# Patient Record
Sex: Female | Born: 1980 | Race: Black or African American | Hispanic: No | Smoking: Never smoker
Health system: Southern US, Community
[De-identification: ages and names within clinical notes are randomized; demographics above are authoritative.]

## PROBLEM LIST (undated history)

## (undated) ENCOUNTER — Inpatient Hospital Stay (HOSPITAL_COMMUNITY): Payer: Self-pay

## (undated) DIAGNOSIS — W3400XA Accidental discharge from unspecified firearms or gun, initial encounter: Secondary | ICD-10-CM

## (undated) DIAGNOSIS — F32A Depression, unspecified: Secondary | ICD-10-CM

## (undated) DIAGNOSIS — B009 Herpesviral infection, unspecified: Secondary | ICD-10-CM

## (undated) DIAGNOSIS — N83209 Unspecified ovarian cyst, unspecified side: Secondary | ICD-10-CM

## (undated) DIAGNOSIS — R87619 Unspecified abnormal cytological findings in specimens from cervix uteri: Secondary | ICD-10-CM

## (undated) DIAGNOSIS — F419 Anxiety disorder, unspecified: Secondary | ICD-10-CM

## (undated) DIAGNOSIS — F329 Major depressive disorder, single episode, unspecified: Secondary | ICD-10-CM

## (undated) DIAGNOSIS — IMO0002 Reserved for concepts with insufficient information to code with codable children: Secondary | ICD-10-CM

## (undated) DIAGNOSIS — O1490 Unspecified pre-eclampsia, unspecified trimester: Secondary | ICD-10-CM

## (undated) DIAGNOSIS — E282 Polycystic ovarian syndrome: Secondary | ICD-10-CM

## (undated) HISTORY — DX: Unspecified pre-eclampsia, unspecified trimester: O14.90

---
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
## 2002-08-24 ENCOUNTER — Inpatient Hospital Stay (HOSPITAL_COMMUNITY): Admission: AD | Admit: 2002-08-24 | Discharge: 2002-08-24 | Payer: Self-pay | Admitting: Obstetrics and Gynecology

## 2002-11-03 ENCOUNTER — Encounter: Admission: RE | Admit: 2002-11-03 | Discharge: 2002-11-03 | Payer: Self-pay | Admitting: Obstetrics and Gynecology

## 2003-11-04 ENCOUNTER — Emergency Department (HOSPITAL_COMMUNITY): Admission: EM | Admit: 2003-11-04 | Discharge: 2003-11-04 | Payer: Self-pay | Admitting: Emergency Medicine

## 2004-07-04 ENCOUNTER — Ambulatory Visit (HOSPITAL_COMMUNITY): Admission: RE | Admit: 2004-07-04 | Discharge: 2004-07-04 | Payer: Self-pay | Admitting: Obstetrics and Gynecology

## 2004-11-08 HISTORY — PX: LAPAROSCOPY: SHX197

## 2004-12-06 ENCOUNTER — Ambulatory Visit (HOSPITAL_COMMUNITY): Admission: RE | Admit: 2004-12-06 | Discharge: 2004-12-06 | Payer: Self-pay | Admitting: Obstetrics and Gynecology

## 2004-12-06 ENCOUNTER — Encounter (INDEPENDENT_AMBULATORY_CARE_PROVIDER_SITE_OTHER): Payer: Self-pay | Admitting: Specialist

## 2005-10-11 ENCOUNTER — Other Ambulatory Visit: Admission: RE | Admit: 2005-10-11 | Discharge: 2005-10-11 | Payer: Self-pay | Admitting: Obstetrics and Gynecology

## 2005-10-13 ENCOUNTER — Emergency Department (HOSPITAL_COMMUNITY): Admission: EM | Admit: 2005-10-13 | Discharge: 2005-10-13 | Payer: Self-pay | Admitting: Emergency Medicine

## 2006-10-14 ENCOUNTER — Other Ambulatory Visit: Admission: RE | Admit: 2006-10-14 | Discharge: 2006-10-14 | Payer: Self-pay | Admitting: *Deleted

## 2006-11-12 ENCOUNTER — Ambulatory Visit (HOSPITAL_COMMUNITY): Admission: RE | Admit: 2006-11-12 | Discharge: 2006-11-12 | Payer: Self-pay | Admitting: Gynecology

## 2007-12-12 ENCOUNTER — Inpatient Hospital Stay (HOSPITAL_COMMUNITY): Admission: AD | Admit: 2007-12-12 | Discharge: 2007-12-13 | Payer: Self-pay | Admitting: Gynecology

## 2007-12-12 ENCOUNTER — Ambulatory Visit: Payer: Self-pay | Admitting: Obstetrics and Gynecology
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
## 2010-05-26 NOTE — Group Therapy Note (Signed)
   NAME:  [name] [name], [name] [name]                        ACCOUNT NO.:  [id number]   MEDICAL RECORD NO.:  [id number]                   PATIENT TYPE:  OUT   LOCATION:  [facility]                           FACILITY:  [facility]   PHYSICIAN:  Argentina Donovan, MD                     DATE OF BIRTH:  04-20-1980   DATE OF SERVICE:                                    CLINIC NOTE   The patient is a 30 year old black female, nulligravida, who has been  amenorrheic since May 2004.  Has had a little spotting intermittently and  has never had regular periods.  Although this is the longest period of  amenorrhea.  She was referred to the [facility] after being screened for pelvic  infections and all was negative.  We did a Pap smear today and the patient  has had unprotected sex for two years.  I have suggested she go on vitamins  with folic acid and especially since her plan is to eventually get pregnant.  She will fall under her husband's health insurance when she gets married in  three weeks and so we will put off her significant workup until after that  time.  She is marked hirsute with acne and amenorrhea and most probable  polycystic ovarian syndrome.  We will give her Provera challenge test at  this point and defer the rest of the workup until we see her at a later  date.                                               Argentina Donovan, MD    PR/MEDQ  D:  11/03/2002  T:  11/03/2002  Job:  [id number]
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
## 2010-09-25 ENCOUNTER — Inpatient Hospital Stay (HOSPITAL_COMMUNITY): Payer: Self-pay

## 2010-09-25 ENCOUNTER — Inpatient Hospital Stay (HOSPITAL_COMMUNITY)
Admission: AD | Admit: 2010-09-25 | Discharge: 2010-09-25 | Disposition: A | Discharge status: Home / Self Care | Payer: Self-pay | Source: Ambulatory Visit | Attending: Obstetrics & Gynecology | Admitting: Obstetrics & Gynecology

## 2010-09-25 DIAGNOSIS — A5901 Trichomonal vulvovaginitis: Secondary | ICD-10-CM

## 2010-09-25 DIAGNOSIS — O039 Complete or unspecified spontaneous abortion without complication: Secondary | ICD-10-CM | POA: Insufficient documentation

## 2010-09-25 LAB — URINALYSIS, ROUTINE W REFLEX MICROSCOPIC
Bilirubin Urine: NEGATIVE
Glucose, UA: NEGATIVE mg/dL
Specific Gravity, Urine: 1.015 (ref 1.005–1.030)

## 2010-09-25 LAB — CBC
Hemoglobin: 12.5 g/dL (ref 12.0–15.0)
Platelets: 307 10*3/uL (ref 150–400)
RBC: 3.75 MIL/uL — ABNORMAL LOW (ref 3.87–5.11)
WBC: 7.7 10*3/uL (ref 4.0–10.5)

## 2010-09-25 LAB — URINE MICROSCOPIC-ADD ON

## 2010-09-25 LAB — WET PREP, GENITAL
WBC, Wet Prep HPF POC: NONE SEEN
Yeast Wet Prep HPF POC: NONE SEEN

## 2010-09-25 LAB — ABO/RH: ABO/RH(D): O POS

## 2010-09-25 LAB — POCT PREGNANCY, URINE: Preg Test, Ur: POSITIVE

## 2010-09-25 LAB — HCG, QUANTITATIVE, PREGNANCY: hCG, Beta Chain, Quant, S: 735 m[IU]/mL — ABNORMAL HIGH (ref <5)

## 2010-09-25 MED ORDER — METRONIDAZOLE 500 MG PO TABS
2000.0000 mg | ORAL_TABLET | Freq: Once | ORAL | Status: AC
  Administered 2010-09-25: 2000 mg via ORAL
  Filled 2010-09-25: qty 4

## 2010-09-25 NOTE — Progress Notes (Signed)
Pt reports LMP 07/06/2010, pt states she did not have a preg test but last Wednesday she had a "miscarraige", states she started bleeding on 09/06 also started having painful cramps. This continued off/on until last Wednesday and the cramping got "severe", and then she passed two large clots that she thought "looked like a baby". States the bleeding is still heavy (pad changed every 1 hour), today cramping is an "8", pt states she has never been pregnant.

## 2010-09-25 NOTE — Progress Notes (Signed)
PT  SAYS HER BLEEDING STARTED ON 09-14-2010-   BEFORE THIS HAD BROWN D/C.     PT WENT TO [place]  ON 09-14-2010- GOT BACK  ON 09-18-2010- HAD ONE DAY  WITH NO BLEEDING.    CRAMPING NOW-   AT HOME TOOK [facility] AND [facility]  YESTERDAY . NO MED    TODAY.  [facility]. THEN WENT TO [facility] ON [address]-  COULD NOT SEE HER., THEN WENT TO [facility]-  WAS BLEEDING.   NOW IN ROOM- NOTHING ON PAD AND NOTHING ON PERINEUM.

## 2010-09-25 NOTE — ED Provider Notes (Signed)
History   Possible SAB at home 09/20/10. Moderate to heavy bleeding continues. Uncertain LMP. Did not know was pregnant. Hx infertility w/ extensive work-up and Lap lysis of adhesions.  CSN: [id number] Arrival date & time: 09/25/2010  8:45 PM   Chief Complaint  Patient presents with  . Menorrhagia     (Include location/radiation/quality/duration/timing/severity/associated sxs/prior treatment) HPI   No past medical history on file. Infertility PCOS  No past surgical history on file. Lap lysis of adhesions for infertility  No family history on file.  History  Substance Use Topics  . Smoking status: Not on file  . Smokeless tobacco: Not on file  . Alcohol Use: Not on file    OB History    No data available    None  Review of Systems  Constitutional: Negative for fever and chills.  Genitourinary: Negative for vaginal bleeding and pelvic pain.  Neurological: Negative for dizziness, syncope and light-headedness.    Allergies  Penicillins  Home Medications  No current outpatient prescriptions on file.  Physical Exam    BP 125/83  Pulse 85  Temp(Src) 98.4 F (36.9 C) (Oral)  Resp 18  Ht 5\' 3"  (1.6 m)  Wt 71.668 kg (158 lb)  BMI 27.99 kg/m2  LMP 09/15/2010  Physical Exam  Constitutional: She appears well-developed and well-nourished. No distress.  Cardiovascular: Normal rate.   Pulmonary/Chest: Effort normal.  Abdominal: Soft. Bowel sounds are normal. She exhibits no mass. There is no tenderness.  Pelvic: Small BRB. No clots or tissue. Cervix long and closed. Uterus slightly enlarged.  ED Course  Procedures  Results for orders placed during the hospital encounter of 09/25/10  URINALYSIS, ROUTINE W REFLEX MICROSCOPIC      Component Value Range   Color, Urine YELLOW  YELLOW    Appearance CLEAR  CLEAR    Specific Gravity, Urine 1.015  1.005 - 1.030    pH 7.5  5.0 - 8.0    Glucose, UA NEGATIVE  NEGATIVE (mg/dL)   Hgb urine dipstick MODERATE (*) NEGATIVE     Bilirubin Urine NEGATIVE  NEGATIVE    Ketones, ur NEGATIVE  NEGATIVE (mg/dL)   Protein, ur NEGATIVE  NEGATIVE (mg/dL)   Urobilinogen, UA 1.0  0.0 - 1.0 (mg/dL)   Nitrite NEGATIVE  NEGATIVE    Leukocytes, UA TRACE (*) NEGATIVE   POCT PREGNANCY, URINE      Component Value Range   Preg Test, Ur POSITIVE    ABO/RH      Component Value Range   ABO/RH(D) O POS    HCG, QUANTITATIVE, PREGNANCY      Component Value Range   hCG, Beta Chain, Quant, S 735 (*) <5 (mIU/mL)  CBC      Component Value Range   WBC 7.7  4.0 - 10.5 (K/uL)   RBC 3.75 (*) 3.87 - 5.11 (MIL/uL)   Hemoglobin 12.5  12.0 - 15.0 (g/dL)   HCT 40.9 (*) 81.1 - 46.0 (%)   MCV 95.7  78.0 - 100.0 (fL)   MCH 33.3  26.0 - 34.0 (pg)   MCHC 34.8  30.0 - 36.0 (g/dL)   RDW 91.4  78.2 - 95.6 (%)   Platelets 307  150 - 400 (K/uL)  URINE MICROSCOPIC-ADD ON      Component Value Range   Squamous Epithelial / LPF FEW (*) RARE    WBC, UA 3-6  <3 (WBC/hpf)   RBC / HPF 3-6  <3 (RBC/hpf)   Bacteria, UA RARE  RARE   WET PREP,  GENITAL      Component Value Range   Yeast, Wet Prep NONE SEEN  NONE SEEN    Trich, Wet Prep FEW (*) NONE SEEN    Clue Cells, Wet Prep FEW (*) NONE SEEN    WBC, Wet Prep HPF POC NONE SEEN  NONE SEEN    US Ob Comp Less 14 Wks  09/25/2010  *RADIOLOGY REPORT*  Clinical Data: 30 year old pregnant female with heavy vaginal bleeding and passing tissue.  Estimated gestational age of [redacted] weeks 4 days by LMP.  OBSTETRIC <14 WK [place] AND TRANSVAGINAL OB US  Technique:  Both transabdominal and transvaginal ultrasound examinations were performed for complete evaluation of the gestation as well as the maternal uterus, adnexal regions, and pelvic cul-de-sac.  Transvaginal technique was performed to assess early pregnancy.  Comparison:  None.  Intrauterine gestational sac:  Not visualized Yolk sac: Not visualized Embryo: Not visualized  Maternal uterus/adnexae: The uterus is normal in appearance. Endometrial stripe measures 7 mm in  diameter and is slightly heterogeneous.  The ovaries bilaterally are unremarkable.  There is no evidence of free fluid or adnexal mass.  IMPRESSION: No evidence of intrauterine gestation.  Given this patient's history, this is suspicious for a spontaneous abortion.  Consider follow-up as indicated.  Otherwise unremarkable exam.  Original Report Authenticated By: Rosendo Gros, M.D.   Assessment: 1. Completed AB vs early pregnancy 2. VB stable  Plan: 1. Return in 48 hours for F/U quant per consult w/ Dr. Penne Lash. 2. Support given 3. Ectopic and bleeding precautions

## 2010-09-27 ENCOUNTER — Encounter (HOSPITAL_COMMUNITY): Payer: Self-pay | Admitting: *Deleted

## 2010-09-27 ENCOUNTER — Inpatient Hospital Stay (HOSPITAL_COMMUNITY)
Admission: AD | Admit: 2010-09-27 | Discharge: 2010-09-27 | Disposition: A | Discharge status: Home / Self Care | Payer: Self-pay | Source: Ambulatory Visit | Attending: Obstetrics and Gynecology | Admitting: Obstetrics and Gynecology

## 2010-09-27 DIAGNOSIS — A7489 Other chlamydial diseases: Secondary | ICD-10-CM

## 2010-09-27 DIAGNOSIS — O039 Complete or unspecified spontaneous abortion without complication: Secondary | ICD-10-CM | POA: Insufficient documentation

## 2010-09-27 DIAGNOSIS — A749 Chlamydial infection, unspecified: Secondary | ICD-10-CM

## 2010-09-27 HISTORY — DX: Unspecified ovarian cyst, unspecified side: N83.209

## 2010-09-27 HISTORY — DX: Depression, unspecified: F32.A

## 2010-09-27 HISTORY — DX: Reserved for concepts with insufficient information to code with codable children: IMO0002

## 2010-09-27 HISTORY — DX: Major depressive disorder, single episode, unspecified: F32.9

## 2010-09-27 HISTORY — DX: Polycystic ovarian syndrome: E28.2

## 2010-09-27 HISTORY — DX: Anxiety disorder, unspecified: F41.9

## 2010-09-27 HISTORY — DX: Unspecified abnormal cytological findings in specimens from cervix uteri: R87.619

## 2010-09-27 LAB — HCG, QUANTITATIVE, PREGNANCY: hCG, Beta Chain, Quant, S: 338 m[IU]/mL — ABNORMAL HIGH (ref <5)

## 2010-09-27 MED ORDER — AZITHROMYCIN 250 MG PO TABS: ORAL_TABLET | ORAL | Status: DC
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
## 2010-09-27 NOTE — Progress Notes (Signed)
Pt states, " I was told to return for lab work today. I had a miscarriage seven days ago. I am bleeding like at the end of a period, and still having mild cramping."

## 2010-10-02 NOTE — ED Provider Notes (Signed)
Beta 735.  Follow up Beta HCG in 2 days. Agree with above note.  LEGGETT,KELLY H. 10/02/2010 2:13 PM

## 2010-10-04 ENCOUNTER — Inpatient Hospital Stay (HOSPITAL_COMMUNITY)
Admission: AD | Admit: 2010-10-04 | Discharge: 2010-10-05 | Disposition: A | Discharge status: Home / Self Care | Payer: Self-pay | Source: Ambulatory Visit | Attending: Obstetrics and Gynecology | Admitting: Obstetrics and Gynecology

## 2010-10-04 DIAGNOSIS — O039 Complete or unspecified spontaneous abortion without complication: Secondary | ICD-10-CM | POA: Insufficient documentation

## 2010-10-04 LAB — HCG, QUANTITATIVE, PREGNANCY: hCG, Beta Chain, Quant, S: 60 m[IU]/mL — ABNORMAL HIGH (ref <5)

## 2010-10-04 NOTE — Progress Notes (Signed)
PT SAYS NO PAIN / NO BLEEDING . HAS HAD LABS DRAWN.  LAST HCG-300.

## 2010-10-04 NOTE — ED Provider Notes (Signed)
  Chief Complaint:  Scheduled f/u quant  [name] [name] is  30 y.o. G1P0 Patient's last menstrual period was 09/15/2010.Marland Kitchen Patient is here for follow up of quantitative HCG and ongoing surveillance of pregnancy status.   She is 4.[redacted] wks gestation  by LMP.  She was treated for trich at the initial visit and she and her partner were treated for chlamydia.  Since her last visit, the patient is without new complaint.   The patient reports bleeding as  none now.    General ROS:  negative for abd pain or cramps, passage of tissue  Her previous Quantitative HCG values are: 09/25/10 735; 09/27/10 338.    Physical Exam   Last menstrual period 09/15/2010.   Labs: Recent Results (from the past 24 hour(s))  HCG, QUANTITATIVE, PREGNANCY   Collection Time   10/04/10 10:15 PM      Component Value Range   hCG, Beta Chain, Quant, S 60 (*) <5 (mIU/mL)    Ultrasound Studies:   US Ob Comp Less 14 Wks  09/25/2010  *RADIOLOGY REPORT*  Clinical Data: 30 year old pregnant female with heavy vaginal bleeding and passing tissue.  Estimated gestational age of [redacted] weeks 4 days by LMP.  OBSTETRIC <14 WK [place] AND TRANSVAGINAL OB US  Technique:  Both transabdominal and transvaginal ultrasound examinations were performed for complete evaluation of the gestation as well as the maternal uterus, adnexal regions, and pelvic cul-de-sac.  Transvaginal technique was performed to assess early pregnancy.  Comparison:  None.  Intrauterine gestational sac:  Not visualized Yolk sac: Not visualized Embryo: Not visualized  Maternal uterus/adnexae: The uterus is normal in appearance. Endometrial stripe measures 7 mm in diameter and is slightly heterogeneous.  The ovaries bilaterally are unremarkable.  There is no evidence of free fluid or adnexal mass.  IMPRESSION: No evidence of intrauterine gestation.  Given this patient's history, this is suspicious for a spontaneous abortion.  Consider follow-up as indicated.  Otherwise unremarkable exam.   Original Report Authenticated By: Rosendo Gros, M.D.   Assessment: Dropping quants c/w SAB   Plan: Follow quant in 1 week. Bleeding and pain precautions reviewed.   [name] [name] 10/04/2010, 11:48 PM

## 2010-10-13 LAB — URINALYSIS, ROUTINE W REFLEX MICROSCOPIC
Nitrite: NEGATIVE
Specific Gravity, Urine: >1.03 — ABNORMAL HIGH (ref 1.005–1.030)
Urobilinogen, UA: 0.2 mg/dL (ref 0.0–1.0)

## 2010-10-13 LAB — CBC
MCV: 96.3 fL (ref 78.0–100.0)
Platelets: 275 10*3/uL (ref 150–400)
WBC: 5.5 10*3/uL (ref 4.0–10.5)

## 2010-10-13 LAB — COMPREHENSIVE METABOLIC PANEL
ALT: 26 U/L (ref 0–35)
AST: 22 U/L (ref 0–37)
Albumin: 3.6 g/dL (ref 3.5–5.2)
Calcium: 8.9 mg/dL (ref 8.4–10.5)
Chloride: 106 mEq/L (ref 96–112)
Creatinine, Ser: 0.71 mg/dL (ref 0.4–1.2)
GFR calc Af Amer: >60 mL/min (ref >60)
Sodium: 139 mEq/L (ref 135–145)
Total Bilirubin: 0.3 mg/dL (ref 0.3–1.2)

## 2010-10-13 LAB — GC/CHLAMYDIA PROBE AMP, GENITAL: Chlamydia, DNA Probe: NEGATIVE

## 2010-10-13 LAB — WET PREP, GENITAL

## 2011-01-27 ENCOUNTER — Inpatient Hospital Stay (HOSPITAL_COMMUNITY): Payer: Medicaid Other

## 2011-01-27 ENCOUNTER — Encounter (HOSPITAL_COMMUNITY): Payer: Self-pay

## 2011-01-27 ENCOUNTER — Inpatient Hospital Stay (HOSPITAL_COMMUNITY)
Admission: AD | Admit: 2011-01-27 | Discharge: 2011-01-27 | Disposition: A | Discharge status: Home / Self Care | Payer: Medicaid Other | Source: Ambulatory Visit | Attending: Obstetrics and Gynecology | Admitting: Obstetrics and Gynecology

## 2011-01-27 DIAGNOSIS — O2390 Unspecified genitourinary tract infection in pregnancy, unspecified trimester: Secondary | ICD-10-CM | POA: Insufficient documentation

## 2011-01-27 DIAGNOSIS — O26899 Other specified pregnancy related conditions, unspecified trimester: Secondary | ICD-10-CM

## 2011-01-27 DIAGNOSIS — N760 Acute vaginitis: Secondary | ICD-10-CM | POA: Insufficient documentation

## 2011-01-27 DIAGNOSIS — N949 Unspecified condition associated with female genital organs and menstrual cycle: Secondary | ICD-10-CM

## 2011-01-27 DIAGNOSIS — R109 Unspecified abdominal pain: Secondary | ICD-10-CM | POA: Insufficient documentation

## 2011-01-27 DIAGNOSIS — A499 Bacterial infection, unspecified: Secondary | ICD-10-CM | POA: Insufficient documentation

## 2011-01-27 DIAGNOSIS — B9689 Other specified bacterial agents as the cause of diseases classified elsewhere: Secondary | ICD-10-CM | POA: Insufficient documentation

## 2011-01-27 LAB — URINE MICROSCOPIC-ADD ON

## 2011-01-27 LAB — URINALYSIS, ROUTINE W REFLEX MICROSCOPIC
Ketones, ur: NEGATIVE mg/dL
Leukocytes, UA: NEGATIVE
Nitrite: NEGATIVE
Protein, ur: NEGATIVE mg/dL
Urobilinogen, UA: 0.2 mg/dL (ref 0.0–1.0)

## 2011-01-27 LAB — CBC
MCH: 33.1 pg (ref 26.0–34.0)
MCHC: 35.5 g/dL (ref 30.0–36.0)
Platelets: 330 10*3/uL (ref 150–400)
RDW: 12.7 % (ref 11.5–15.5)

## 2011-01-27 LAB — WET PREP, GENITAL: Yeast Wet Prep HPF POC: NONE SEEN

## 2011-01-27 LAB — ABO/RH: ABO/RH(D): O POS

## 2011-01-27 MED ORDER — METRONIDAZOLE 500 MG PO TABS: 500.0000 mg | ORAL_TABLET | Freq: Two times a day (BID) | ORAL | Status: AC
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
## 2011-01-27 NOTE — Progress Notes (Signed)
Pt reports having miscarriage in September. Had normal period in Nov. 11/7. Pt had period in December 12/12 (late). Pt reports no period this month and had positive pregnancy test today. [facility]old to [facility]  if she got pregnant [facility].

## 2011-01-30 NOTE — ED Provider Notes (Signed)
Agree with above note.  [name] [name] 01/30/2011 8:02 AM   

## 2011-02-13 LAB — OB RESULTS CONSOLE ANTIBODY SCREEN: Antibody Screen: NEGATIVE

## 2011-02-13 LAB — OB RESULTS CONSOLE ABO/RH

## 2011-03-05 ENCOUNTER — Inpatient Hospital Stay (HOSPITAL_COMMUNITY): Payer: Medicaid Other

## 2011-03-05 ENCOUNTER — Inpatient Hospital Stay (HOSPITAL_COMMUNITY)
Admission: AD | Admit: 2011-03-05 | Discharge: 2011-03-05 | Disposition: A | Discharge status: Home / Self Care | Payer: Medicaid Other | Source: Ambulatory Visit | Attending: Obstetrics and Gynecology | Admitting: Obstetrics and Gynecology

## 2011-03-05 ENCOUNTER — Encounter (HOSPITAL_COMMUNITY): Payer: Self-pay | Admitting: *Deleted

## 2011-03-05 DIAGNOSIS — R109 Unspecified abdominal pain: Secondary | ICD-10-CM | POA: Insufficient documentation

## 2011-03-05 DIAGNOSIS — O209 Hemorrhage in early pregnancy, unspecified: Secondary | ICD-10-CM | POA: Insufficient documentation

## 2011-03-05 LAB — WET PREP, GENITAL
Trich, Wet Prep: NONE SEEN
Yeast Wet Prep HPF POC: NONE SEEN

## 2011-03-05 LAB — URINALYSIS, ROUTINE W REFLEX MICROSCOPIC
Glucose, UA: NEGATIVE mg/dL
Leukocytes, UA: NEGATIVE
Protein, ur: 100 mg/dL — AB
Specific Gravity, Urine: >1.03 — ABNORMAL HIGH (ref 1.005–1.030)
pH: 5.5 (ref 5.0–8.0)

## 2011-03-05 LAB — URINE MICROSCOPIC-ADD ON

## 2011-03-05 MED ORDER — METRONIDAZOLE 500 MG PO TABS: 500.0000 mg | ORAL_TABLET | Freq: Two times a day (BID) | ORAL | Status: AC

## 2011-03-05 NOTE — Progress Notes (Signed)
S. Lillard, CNM at bedside.  Assessment done and poc discussed with pt.  

## 2011-03-05 NOTE — Discharge Instructions (Signed)
Call your doctor with any concerns or questions.
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
## 2011-03-05 NOTE — Progress Notes (Signed)
Dr. Estanislado Pandy notified of pt presenting to [facility] with c/o cramping. Notified of pt c/o spotting when wiping.  Notified of pt being upset with s/o about another female.  Notified of inability to find CNM.  Called and left message and no return call.  No answer from call room.  MD will locate CNM.
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
## 2011-03-05 NOTE — Progress Notes (Signed)
SSE done per CNM.  Wet prep and cultures collected.  VE done.  

## 2011-03-05 NOTE — Progress Notes (Signed)
Pt reports she had become upset (emothionally) at home earlier and then started having lower abd cramping. States denies bleeding.

## 2011-03-05 NOTE — Progress Notes (Signed)
Provider notified that pt not able to take flagyl.  Pt requesting gel for BV.  Provider to call in script for pt.

## 2011-03-06 LAB — GC/CHLAMYDIA PROBE AMP, GENITAL
Chlamydia, DNA Probe: NEGATIVE
GC Probe Amp, Genital: NEGATIVE

## 2011-03-13 ENCOUNTER — Encounter (INDEPENDENT_AMBULATORY_CARE_PROVIDER_SITE_OTHER): Payer: Medicaid Other | Admitting: Obstetrics and Gynecology

## 2011-03-13 DIAGNOSIS — Z331 Pregnant state, incidental: Secondary | ICD-10-CM

## 2011-04-10 ENCOUNTER — Encounter (INDEPENDENT_AMBULATORY_CARE_PROVIDER_SITE_OTHER): Payer: Medicaid Other | Admitting: Obstetrics and Gynecology

## 2011-04-10 DIAGNOSIS — Z3400 Encounter for supervision of normal first pregnancy, unspecified trimester: Secondary | ICD-10-CM

## 2011-05-07 ENCOUNTER — Other Ambulatory Visit: Payer: Self-pay | Admitting: Obstetrics and Gynecology

## 2011-05-07 DIAGNOSIS — Z1389 Encounter for screening for other disorder: Secondary | ICD-10-CM

## 2011-05-08 ENCOUNTER — Ambulatory Visit (INDEPENDENT_AMBULATORY_CARE_PROVIDER_SITE_OTHER): Payer: Medicaid Other | Admitting: Obstetrics and Gynecology

## 2011-05-08 ENCOUNTER — Other Ambulatory Visit: Payer: Medicaid Other

## 2011-05-08 ENCOUNTER — Encounter: Payer: Self-pay | Admitting: Obstetrics and Gynecology

## 2011-05-08 ENCOUNTER — Ambulatory Visit (INDEPENDENT_AMBULATORY_CARE_PROVIDER_SITE_OTHER): Payer: Medicaid Other

## 2011-05-08 VITALS — BP 98/64 | Wt 168.0 lb

## 2011-05-08 DIAGNOSIS — Z1389 Encounter for screening for other disorder: Secondary | ICD-10-CM

## 2011-05-08 DIAGNOSIS — Z331 Pregnant state, incidental: Secondary | ICD-10-CM

## 2011-05-08 LAB — US OB DETAIL + 14 WK

## 2011-05-08 MED ORDER — PRAMOXINE HCL 1 % RE FOAM: RECTAL | Status: AC | PRN

## 2011-05-08 NOTE — Progress Notes (Signed)
Pt c/o cramping yesterday. She also thinks she may have a hemorrhoid.  Pt denies having any  Bleeding or leakage of fluid.   Abd: gravid soft NT cx long and closed Small hemorrhoid noted. Rt in 4 weeks proctofoam for hemorrhoid pain.  Increase water and fiber in diet Pt declined genetic testing\ Anatomy US s=d  SIUP VTX placenta posterior  Grade zero.  Normal fluid.  Normal anatomy

## 2011-05-08 NOTE — Patient Instructions (Signed)

## 2011-05-16 ENCOUNTER — Other Ambulatory Visit: Payer: Self-pay

## 2011-05-24 ENCOUNTER — Encounter (HOSPITAL_COMMUNITY): Payer: Self-pay | Admitting: *Deleted

## 2011-05-24 ENCOUNTER — Inpatient Hospital Stay (HOSPITAL_COMMUNITY)
Admission: AD | Admit: 2011-05-24 | Discharge: 2011-05-25 | Disposition: A | Discharge status: Home / Self Care | Payer: Medicaid Other | Source: Ambulatory Visit | Attending: Obstetrics and Gynecology | Admitting: Obstetrics and Gynecology

## 2011-05-24 DIAGNOSIS — Z331 Pregnant state, incidental: Secondary | ICD-10-CM

## 2011-05-24 DIAGNOSIS — R109 Unspecified abdominal pain: Secondary | ICD-10-CM

## 2011-05-24 DIAGNOSIS — R1032 Left lower quadrant pain: Secondary | ICD-10-CM | POA: Insufficient documentation

## 2011-05-24 DIAGNOSIS — O99891 Other specified diseases and conditions complicating pregnancy: Secondary | ICD-10-CM | POA: Insufficient documentation

## 2011-05-24 NOTE — MAU Note (Signed)
Pt G2 P0 at 21.5wks having LLQ pain that started suddenly this evening.  Denies bleeding or discharge.

## 2011-05-25 ENCOUNTER — Telehealth: Payer: Self-pay

## 2011-05-25 LAB — URINALYSIS, ROUTINE W REFLEX MICROSCOPIC
Bilirubin Urine: NEGATIVE
Ketones, ur: 15 mg/dL — AB
Leukocytes, UA: NEGATIVE
Nitrite: NEGATIVE
Specific Gravity, Urine: >1.03 — ABNORMAL HIGH (ref 1.005–1.030)
Urobilinogen, UA: 0.2 mg/dL (ref 0.0–1.0)

## 2011-05-25 LAB — GC/CHLAMYDIA PROBE AMP, GENITAL
Chlamydia, DNA Probe: NEGATIVE
GC Probe Amp, Genital: NEGATIVE

## 2011-05-25 LAB — WET PREP, GENITAL
Clue Cells Wet Prep HPF POC: NONE SEEN
Trich, Wet Prep: NONE SEEN

## 2011-05-25 MED ORDER — IBUPROFEN 600 MG PO TABS
600.0000 mg | ORAL_TABLET | Freq: Once | ORAL | Status: AC
  Administered 2011-05-25: 600 mg via ORAL
  Filled 2011-05-25: qty 1

## 2011-05-25 MED ORDER — IBUPROFEN 200 MG PO TABS: 600.0000 mg | ORAL_TABLET | Freq: Four times a day (QID) | ORAL | Status: AC | PRN

## 2011-05-25 MED ORDER — PRAMOXINE-HC 1-2.5 % EX CREA: TOPICAL_CREAM | Freq: Three times a day (TID) | CUTANEOUS | Status: DC

## 2011-05-25 NOTE — Telephone Encounter (Signed)
Message copied by Janeece Agee on Fri May 25, 2011  9:44 AM ------      Message from: [name].      Created: Fri May 25, 2011  2:35 AM      Regarding: f/u on UA and cx       Pt is 21w and was seen at MAU on 5-17      UA and cx was sent, results pending upon d/c.      Can you please f/u and notify pt/provider on call if abnormal results.       Thanks!      Vance Gather

## 2011-05-25 NOTE — MAU Note (Signed)
S. Lillard CNM at the bedside. 

## 2011-05-25 NOTE — MAU Note (Signed)
PO fluids given

## 2011-05-25 NOTE — MAU Note (Signed)
S. Lillard CNM notified of pt. 

## 2011-05-25 NOTE — Discharge Instructions (Signed)
Hemorrhoids Hemorrhoids are enlarged (dilated) veins around the rectum. There are 2 types of hemorrhoids, and the type of hemorrhoid is determined by its location. Internal hemorrhoids occur in the veins just inside the rectum.They are usually not painful, but they may bleed.However, they may poke through to the outside and become irritated and painful. External hemorrhoids involve the veins outside the anus and can be felt as a painful swelling or hard lump near the anus.They are often itchy and may crack and bleed. Sometimes clots will form in the veins. This makes them swollen and painful. These are called thrombosed hemorrhoids. CAUSES Causes of hemorrhoids include:  Pregnancy. This increases the pressure in the hemorrhoidal veins.   Constipation.   Straining to have a bowel movement.   Obesity.   Heavy lifting or other activity that caused you to strain.  TREATMENT Most of the time hemorrhoids improve in 1 to 2 weeks. However, if symptoms do not seem to be getting better or if you have a lot of rectal bleeding, your caregiver may perform a procedure to help make the hemorrhoids get smaller or remove them completely.Possible treatments include:  Rubber band ligation. A rubber band is placed at the base of the hemorrhoid to cut off the circulation.   Sclerotherapy. A chemical is injected to shrink the hemorrhoid.   Infrared light therapy. Tools are used to burn the hemorrhoid.   Hemorrhoidectomy. This is surgical removal of the hemorrhoid.  HOME CARE INSTRUCTIONS   Increase fiber in your diet. Ask your caregiver about using fiber supplements.   Drink enough water and fluids to keep your urine clear or pale yellow.   Exercise regularly.   Go to the bathroom when you have the urge to have a bowel movement. Do not wait.   Avoid straining to have bowel movements.   Keep the anal area dry and clean.   Only take over-the-counter or prescription medicines for pain, discomfort,  or fever as directed by your caregiver.   You have been prescribed protofoam to apply for discomfort to the hemorroid  You may also take colace 1-2 tablets daily to soften your stools If your hemorrhoids are thrombosed:  Take warm sitz baths for 20 to 30 minutes, 3 to 4 times per day.   If the hemorrhoids are very tender and swollen, place ice packs on the area as tolerated. Using ice packs between sitz baths may be helpful. Fill a plastic bag with ice. Place a towel between the bag of ice and your skin.   Medicated creams and suppositories may be used or applied as directed.   Do not use a donut-shaped pillow or sit on the toilet for long periods. This increases blood pooling and pain.  SEEK MEDICAL CARE IF:   You have increasing pain and swelling that is not controlled with your medicine.   You have uncontrolled bleeding.   You have difficulty or you are unable to have a bowel movement.   You have pain or inflammation outside the area of the hemorrhoids.   You have chills or an oral temperature above 102 F (38.9 C).  MAKE SURE YOU:   Understand these instructions.   Will watch your condition.   Will get help right away if you are not doing well or get worse.  Document Released: 12/23/1999 Document Revised: 12/14/2010 Document Reviewed: 04/29/2007 [facility] Patient Information 2012 [place], [place].Marland KitchenRound Ligament Pain The round ligament is made up of muscle and fibrous tissue. It is attached to the uterus  near the fallopian tube. The round ligament is located on both sides of the uterus and helps support the position of the uterus. It usually begins in the second trimester of pregnancy when the uterus comes out of the pelvis. The pain can come and go until the baby is delivered. Round ligament pain is not a serious problem and does not cause harm to the baby. CAUSE During pregnancy the uterus grows the most from the second trimester to delivery. As it grows, it stretches and  slightly twists the round ligaments. When the uterus leans from one side to the other, the round ligament on the opposite side pulls and stretches. This can cause pain. SYMPTOMS  Pain can occur on one side or both sides. The pain is usually a short, sharp, and pinching-like. Sometimes it can be a dull, lingering and aching pain. The pain is located in the lower side of the abdomen or in the groin. The pain is internal and usually starts deep in the groin and moves up to the outside of the hip area. Pain can occur with:  Sudden change in position like getting out of bed or a chair.   Rolling over in bed.   Coughing or sneezing.   Walking too much.   Any type of physical activity.  DIAGNOSIS  Your caregiver will make sure there are no serious problems causing the pain. When nothing serious is found, the symptoms usually indicate that the pain is from the round ligament. TREATMENT   Sit down and relax when the pain starts.   Flex your knees up to your belly.   Lay on your side with a pillow under your belly (abdomen) and another one between your legs.   Sit in a hot bath for 15 to 20 minutes or until the pain goes away.  HOME CARE INSTRUCTIONS   Only take over-the-counter or prescriptions medicines for pain, discomfort or fever as directed by your caregiver.   Sit and stand slowly.   Avoid long walks if it causes pain.   Stop or lessen your physical activities if it causes pain.  SEEK MEDICAL CARE IF:   The pain does not go away with any of your treatment.   You need stronger medication for the pain.   You develop back pain that you did not have before with the side pain.  SEEK IMMEDIATE MEDICAL CARE IF:   You develop a temperature of 102 F (38.9 C) or higher.   You develop uterine contractions.   You develop vaginal bleeding.   You develop nausea, vomiting or diarrhea.   You develop chills.   You have pain when you urinate.  Document Released: 10/04/2007 Document  Revised: 12/14/2010 Document Reviewed: 10/04/2007 [facility] Patient Information 2012 [place], [place].
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
## 2011-05-28 DIAGNOSIS — J45909 Unspecified asthma, uncomplicated: Secondary | ICD-10-CM | POA: Insufficient documentation

## 2011-05-28 DIAGNOSIS — Z880 Allergy status to penicillin: Secondary | ICD-10-CM | POA: Insufficient documentation

## 2011-05-31 NOTE — Telephone Encounter (Signed)
Spoke with pt regarding UA culture.

## 2011-05-31 NOTE — Telephone Encounter (Signed)
Per SL watch for UA culture & GC/CT results. If neg, inform pt & provider on call. GC/CT neg however no UA culture results. Attempted to call pt to verify whether or not she knew they were going to send her urine for culture no response. Informed SL I didn't see a UA culture from [facility]. Vance Gather states the UA dipstick was neg no UA culture was sent.

## 2011-06-05 ENCOUNTER — Ambulatory Visit (INDEPENDENT_AMBULATORY_CARE_PROVIDER_SITE_OTHER): Payer: Medicaid Other | Admitting: Obstetrics and Gynecology

## 2011-06-05 VITALS — BP 100/64 | Wt 172.0 lb

## 2011-06-05 DIAGNOSIS — Z331 Pregnant state, incidental: Secondary | ICD-10-CM

## 2011-06-05 MED ORDER — HYDROCORTISONE ACE-PRAMOXINE 1-1 % RE FOAM: 1.0000 | Freq: Two times a day (BID) | RECTAL | Status: AC

## 2011-06-05 MED ORDER — CETIRIZINE HCL 10 MG PO CAPS: 10.0000 mg | ORAL_CAPSULE | Freq: Every day | ORAL | Status: DC

## 2011-06-05 NOTE — Progress Notes (Signed)
C/O shortness of breath with tightness of chest. Feels like someone is suffocating her. No coughing. Known for seasonal allergies but has not taken anything for it. Lungs: clear Plan: Zyrtec and Mucinex daily

## 2011-06-05 NOTE — Progress Notes (Signed)
Pt. Stated having some ligament pain, pt stated having trouble breathing pt stated has asthma.

## 2011-07-04 ENCOUNTER — Other Ambulatory Visit: Payer: Medicaid Other

## 2011-07-04 ENCOUNTER — Encounter: Payer: Self-pay | Admitting: Obstetrics and Gynecology

## 2011-07-04 ENCOUNTER — Ambulatory Visit (INDEPENDENT_AMBULATORY_CARE_PROVIDER_SITE_OTHER): Payer: Medicaid Other | Admitting: Obstetrics and Gynecology

## 2011-07-04 ENCOUNTER — Encounter: Payer: Medicaid Other | Admitting: Obstetrics and Gynecology

## 2011-07-04 VITALS — BP 110/64 | Wt 174.0 lb

## 2011-07-04 DIAGNOSIS — Z3480 Encounter for supervision of other normal pregnancy, unspecified trimester: Secondary | ICD-10-CM

## 2011-07-04 NOTE — Progress Notes (Signed)
C/O vomiting almost every meal but keeps liquids Still struggling with breathing: air does not get in, feels like a pillow over my head despite Zyrtec and Mucinex No chest pain. Worried because of heart disease in family. Will refer to cardiology

## 2011-07-05 ENCOUNTER — Encounter: Payer: Medicaid Other | Admitting: Obstetrics and Gynecology

## 2011-07-05 ENCOUNTER — Other Ambulatory Visit: Payer: Medicaid Other

## 2011-07-05 LAB — CBC
HCT: 33 % — ABNORMAL LOW (ref 36.0–46.0)
MCHC: 34.8 g/dL (ref 30.0–36.0)
MCV: 95.1 fL (ref 78.0–100.0)
Platelets: 330 10*3/uL (ref 150–400)
RDW: 13.4 % (ref 11.5–15.5)

## 2011-07-10 ENCOUNTER — Other Ambulatory Visit: Payer: Self-pay

## 2011-07-10 ENCOUNTER — Telehealth: Payer: Self-pay

## 2011-07-10 DIAGNOSIS — R0602 Shortness of breath: Secondary | ICD-10-CM

## 2011-07-10 NOTE — Telephone Encounter (Signed)
LM for pt that she has appt at [facility] on Aug 5 @ 11:00 with Dr Dietrich Pates.  Notified pt that she can call them to r/s if needed.  Referral done.  ld

## 2011-07-10 NOTE — Telephone Encounter (Signed)
Message copied by Larwance Rote on Tue Jul 10, 2011  9:50 AM ------      Message from: Silverio Lay      Created: Wed Jul 04, 2011  5:12 PM      Regarding: referral       27+ weeks pregnant with unexplained SOB      Refer to cardiology

## 2011-07-16 ENCOUNTER — Ambulatory Visit (INDEPENDENT_AMBULATORY_CARE_PROVIDER_SITE_OTHER): Payer: Medicaid Other | Admitting: Obstetrics and Gynecology

## 2011-07-16 VITALS — BP 104/52 | Wt 176.0 lb

## 2011-07-16 DIAGNOSIS — Z331 Pregnant state, incidental: Secondary | ICD-10-CM

## 2011-07-16 MED ORDER — PRAMOXINE-HC 1-2.5 % EX CREA: TOPICAL_CREAM | Freq: Three times a day (TID) | CUTANEOUS | Status: DC

## 2011-07-16 NOTE — Progress Notes (Signed)
Glucola=119, Hgb=11.5, RPR negative [facility] appointment 08/13/11  SOB is the same Needs refill on Proctofoam Travel precautions reviewed

## 2011-07-16 NOTE — Progress Notes (Signed)
Pt stated no issues today.  

## 2011-07-30 ENCOUNTER — Telehealth: Payer: Self-pay | Admitting: Obstetrics and Gynecology

## 2011-07-30 ENCOUNTER — Encounter: Payer: Self-pay | Admitting: Obstetrics and Gynecology

## 2011-07-30 ENCOUNTER — Ambulatory Visit (INDEPENDENT_AMBULATORY_CARE_PROVIDER_SITE_OTHER): Payer: Medicaid Other | Admitting: Obstetrics and Gynecology

## 2011-07-30 VITALS — BP 104/62 | Wt 175.0 lb

## 2011-07-30 DIAGNOSIS — N899 Noninflammatory disorder of vagina, unspecified: Secondary | ICD-10-CM

## 2011-07-30 DIAGNOSIS — Z331 Pregnant state, incidental: Secondary | ICD-10-CM

## 2011-07-30 DIAGNOSIS — Z3490 Encounter for supervision of normal pregnancy, unspecified, unspecified trimester: Secondary | ICD-10-CM

## 2011-07-30 DIAGNOSIS — N898 Other specified noninflammatory disorders of vagina: Secondary | ICD-10-CM

## 2011-07-30 DIAGNOSIS — B379 Candidiasis, unspecified: Secondary | ICD-10-CM

## 2011-07-30 DIAGNOSIS — B49 Unspecified mycosis: Secondary | ICD-10-CM

## 2011-07-30 MED ORDER — TERCONAZOLE 80 MG VA SUPP: 80.0000 mg | Freq: Every day | VAGINAL | Status: AC

## 2011-07-30 NOTE — Progress Notes (Signed)
Pt c/o itching and burning. Believes she may have yeast infection.

## 2011-07-30 NOTE — Telephone Encounter (Signed)
Triage/epic 

## 2011-07-30 NOTE — Telephone Encounter (Signed)
Spoke with pt

## 2011-07-30 NOTE — Progress Notes (Signed)
[redacted]w[redacted]d  Braxton Hicks CTX  c/o vaginal itching.  Wet Prep:4.0 Yeast

## 2011-07-30 NOTE — Telephone Encounter (Signed)
TC from pt states she didn't receive her rx for Zantac. Pt informed during visit with [facility] she can get it OTC. Pt agrees and voices understanding.

## 2011-07-31 ENCOUNTER — Telehealth: Payer: Self-pay | Admitting: Obstetrics and Gynecology

## 2011-07-31 DIAGNOSIS — Z331 Pregnant state, incidental: Secondary | ICD-10-CM

## 2011-08-02 ENCOUNTER — Telehealth: Payer: Self-pay

## 2011-08-02 NOTE — Telephone Encounter (Signed)
LM for pt that per DD, it is ok to use OTC med as long as it has hydrocortisone in it.  Pt to call with any further questions.   Will d/c rx at pharmacy due to extreme cost,  ld

## 2011-08-06 ENCOUNTER — Inpatient Hospital Stay (HOSPITAL_COMMUNITY)
Admission: AD | Admit: 2011-08-06 | Discharge: 2011-08-06 | Disposition: A | Discharge status: Home / Self Care | Payer: Medicaid Other | Source: Ambulatory Visit | Attending: Obstetrics and Gynecology | Admitting: Obstetrics and Gynecology

## 2011-08-06 ENCOUNTER — Encounter (HOSPITAL_COMMUNITY): Payer: Self-pay | Admitting: *Deleted

## 2011-08-06 DIAGNOSIS — O99891 Other specified diseases and conditions complicating pregnancy: Secondary | ICD-10-CM | POA: Insufficient documentation

## 2011-08-06 DIAGNOSIS — R109 Unspecified abdominal pain: Secondary | ICD-10-CM | POA: Insufficient documentation

## 2011-08-06 DIAGNOSIS — O26879 Cervical shortening, unspecified trimester: Secondary | ICD-10-CM

## 2011-08-06 LAB — URINALYSIS, ROUTINE W REFLEX MICROSCOPIC
Bilirubin Urine: NEGATIVE
Glucose, UA: NEGATIVE mg/dL
Hgb urine dipstick: NEGATIVE
Ketones, ur: 15 mg/dL — AB
pH: 6 (ref 5.0–8.0)

## 2011-08-06 LAB — URINE MICROSCOPIC-ADD ON

## 2011-08-06 MED ORDER — ACETAMINOPHEN 500 MG PO TABS
1000.0000 mg | ORAL_TABLET | Freq: Once | ORAL | Status: AC
  Administered 2011-08-06: 1000 mg via ORAL
  Filled 2011-08-06: qty 2
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
## 2011-08-06 NOTE — MAU Note (Signed)
Pt reports she was awakened at 0200 with "a lot of pain" in upper abd and rt side. Denies bleeding or ROM. Denies dysuria.

## 2011-08-06 NOTE — MAU Note (Signed)
Po hydrating 

## 2011-08-13 ENCOUNTER — Ambulatory Visit (INDEPENDENT_AMBULATORY_CARE_PROVIDER_SITE_OTHER): Payer: Medicaid Other | Admitting: Obstetrics and Gynecology

## 2011-08-13 ENCOUNTER — Encounter: Payer: Self-pay | Admitting: Internal Medicine

## 2011-08-13 ENCOUNTER — Ambulatory Visit (INDEPENDENT_AMBULATORY_CARE_PROVIDER_SITE_OTHER): Payer: Medicaid Other | Admitting: Internal Medicine

## 2011-08-13 VITALS — BP 100/70 | Wt 178.0 lb

## 2011-08-13 VITALS — BP 118/77 | HR 78 | Wt 178.0 lb

## 2011-08-13 DIAGNOSIS — R002 Palpitations: Secondary | ICD-10-CM

## 2011-08-13 DIAGNOSIS — N390 Urinary tract infection, site not specified: Secondary | ICD-10-CM

## 2011-08-13 DIAGNOSIS — Z331 Pregnant state, incidental: Secondary | ICD-10-CM

## 2011-08-13 LAB — POCT URINALYSIS DIPSTICK
Blood, UA: NEGATIVE
Glucose, UA: NEGATIVE
Leukocytes, UA: NEGATIVE
Nitrite, UA: NEGATIVE
Urobilinogen, UA: NEGATIVE

## 2011-08-13 NOTE — Progress Notes (Signed)
Pt.stated still having trouble breathing/ some side pain due to where baby is laying . Using the bathroom frequently.
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
## 2011-08-13 NOTE — Patient Instructions (Signed)
Your physician has requested that you have an echocardiogram. Echocardiography is a painless test that uses sound waves to create images of your heart. It provides your doctor with information about the size and shape of your heart and how well your heart's chambers and valves are working. This procedure takes approximately one hour. There are no restrictions for this procedure.   

## 2011-08-13 NOTE — Progress Notes (Signed)
Cervix closed and long. Patient complains of shortness of breath.  Chest clear.  Heart regular rate and rhythm.  Patient has an appointment with the cardiologist today. Return office in 2 weeks. Dr. Stefano Gaul

## 2011-08-20 ENCOUNTER — Ambulatory Visit (HOSPITAL_COMMUNITY): Payer: Medicaid Other | Attending: Internal Medicine | Admitting: Radiology

## 2011-08-20 DIAGNOSIS — I2510 Atherosclerotic heart disease of native coronary artery without angina pectoris: Secondary | ICD-10-CM | POA: Insufficient documentation

## 2011-08-20 DIAGNOSIS — R002 Palpitations: Secondary | ICD-10-CM | POA: Insufficient documentation

## 2011-08-20 DIAGNOSIS — O99419 Diseases of the circulatory system complicating pregnancy, unspecified trimester: Secondary | ICD-10-CM | POA: Insufficient documentation

## 2011-08-20 DIAGNOSIS — R0989 Other specified symptoms and signs involving the circulatory and respiratory systems: Secondary | ICD-10-CM | POA: Insufficient documentation

## 2011-08-20 DIAGNOSIS — R0609 Other forms of dyspnea: Secondary | ICD-10-CM | POA: Insufficient documentation

## 2011-08-20 NOTE — Progress Notes (Signed)
Echocardiogram performed.  

## 2011-08-27 ENCOUNTER — Ambulatory Visit (INDEPENDENT_AMBULATORY_CARE_PROVIDER_SITE_OTHER): Payer: Medicaid Other | Admitting: Obstetrics and Gynecology

## 2011-08-27 VITALS — BP 100/64 | Wt 183.0 lb

## 2011-08-27 DIAGNOSIS — N898 Other specified noninflammatory disorders of vagina: Secondary | ICD-10-CM

## 2011-08-27 DIAGNOSIS — Z331 Pregnant state, incidental: Secondary | ICD-10-CM

## 2011-08-27 DIAGNOSIS — N899 Noninflammatory disorder of vagina, unspecified: Secondary | ICD-10-CM

## 2011-08-27 MED ORDER — NYSTATIN-TRIAMCINOLONE 100000-0.1 UNIT/GM-% EX OINT: TOPICAL_OINTMENT | Freq: Two times a day (BID) | CUTANEOUS | Status: DC

## 2011-08-27 NOTE — Addendum Note (Signed)
Addended by: Tim Lair on: 08/27/2011 01:50 PM   Modules accepted: Orders

## 2011-08-27 NOTE — Progress Notes (Signed)
Pt. Stated she might have another yeast infection .no other issues today .

## 2011-08-27 NOTE — Progress Notes (Signed)
[redacted]w[redacted]d Beta strep, GC, Chlamydia, wet prep; yeast. Mycolog-II sent the pharmacy. Return to office in 1 week. Dr. Stefano Gaul

## 2011-08-28 LAB — GC/CHLAMYDIA PROBE AMP, GENITAL
Chlamydia, DNA Probe: NEGATIVE
GC Probe Amp, Genital: NEGATIVE

## 2011-08-29 LAB — STREP B DNA PROBE: GBSP: POSITIVE

## 2011-09-03 ENCOUNTER — Encounter: Payer: Self-pay | Admitting: Obstetrics and Gynecology

## 2011-09-03 ENCOUNTER — Encounter: Payer: Medicaid Other | Admitting: Obstetrics and Gynecology

## 2011-09-03 ENCOUNTER — Ambulatory Visit (INDEPENDENT_AMBULATORY_CARE_PROVIDER_SITE_OTHER): Payer: Medicaid Other | Admitting: Obstetrics and Gynecology

## 2011-09-03 VITALS — BP 110/68 | Wt 188.0 lb

## 2011-09-03 DIAGNOSIS — O09899 Supervision of other high risk pregnancies, unspecified trimester: Secondary | ICD-10-CM

## 2011-09-03 DIAGNOSIS — Z22330 Carrier of Group B streptococcus: Secondary | ICD-10-CM

## 2011-09-03 DIAGNOSIS — O99820 Streptococcus B carrier state complicating pregnancy: Secondary | ICD-10-CM

## 2011-09-03 NOTE — Progress Notes (Signed)
Gc/ct neg 08/27/11 C/o excessive swelling in feet

## 2011-09-03 NOTE — Addendum Note (Signed)
Addended by: Janeece Agee on: 09/03/2011 10:44 AM   Modules accepted: Orders

## 2011-09-03 NOTE — Progress Notes (Signed)
Doing well, but feeling ready! 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 Cervix FT, thick, Vtx, -2. Cultures negative.

## 2011-09-06 LAB — CULTURE, BETA STREP (GROUP B ONLY)

## 2011-09-11 ENCOUNTER — Other Ambulatory Visit: Payer: Self-pay

## 2011-09-11 ENCOUNTER — Ambulatory Visit (INDEPENDENT_AMBULATORY_CARE_PROVIDER_SITE_OTHER): Payer: Medicaid Other | Admitting: Obstetrics and Gynecology

## 2011-09-11 ENCOUNTER — Ambulatory Visit (HOSPITAL_COMMUNITY)
Admission: RE | Admit: 2011-09-11 | Discharge: 2011-09-11 | Disposition: A | Discharge status: Home / Self Care | Payer: Medicaid Other | Source: Ambulatory Visit | Attending: Obstetrics and Gynecology | Admitting: Obstetrics and Gynecology

## 2011-09-11 ENCOUNTER — Telehealth: Payer: Self-pay

## 2011-09-11 ENCOUNTER — Other Ambulatory Visit (HOSPITAL_COMMUNITY): Payer: Self-pay | Admitting: Obstetrics and Gynecology

## 2011-09-11 ENCOUNTER — Encounter (HOSPITAL_COMMUNITY): Payer: Self-pay | Admitting: *Deleted

## 2011-09-11 ENCOUNTER — Encounter: Payer: Self-pay | Admitting: Obstetrics and Gynecology

## 2011-09-11 ENCOUNTER — Inpatient Hospital Stay (HOSPITAL_COMMUNITY)
Admission: AD | Admit: 2011-09-11 | Discharge: 2011-09-11 | Disposition: A | Discharge status: Home / Self Care | Payer: Medicaid Other | Source: Ambulatory Visit | Attending: Obstetrics and Gynecology | Admitting: Obstetrics and Gynecology

## 2011-09-11 VITALS — BP 112/62 | Wt 184.0 lb

## 2011-09-11 DIAGNOSIS — O368190 Decreased fetal movements, unspecified trimester, not applicable or unspecified: Secondary | ICD-10-CM

## 2011-09-11 DIAGNOSIS — O9989 Other specified diseases and conditions complicating pregnancy, childbirth and the puerperium: Secondary | ICD-10-CM

## 2011-09-11 DIAGNOSIS — W010XXA Fall on same level from slipping, tripping and stumbling without subsequent striking against object, initial encounter: Secondary | ICD-10-CM | POA: Insufficient documentation

## 2011-09-11 DIAGNOSIS — Y93E1 Activity, personal bathing and showering: Secondary | ICD-10-CM | POA: Insufficient documentation

## 2011-09-11 DIAGNOSIS — Z3689 Encounter for other specified antenatal screening: Secondary | ICD-10-CM | POA: Insufficient documentation

## 2011-09-11 DIAGNOSIS — M259 Joint disorder, unspecified: Secondary | ICD-10-CM

## 2011-09-11 DIAGNOSIS — M549 Dorsalgia, unspecified: Secondary | ICD-10-CM

## 2011-09-11 DIAGNOSIS — O99891 Other specified diseases and conditions complicating pregnancy: Secondary | ICD-10-CM | POA: Insufficient documentation

## 2011-09-11 DIAGNOSIS — R51 Headache: Secondary | ICD-10-CM

## 2011-09-11 DIAGNOSIS — M899 Disorder of bone, unspecified: Secondary | ICD-10-CM

## 2011-09-11 DIAGNOSIS — M545 Low back pain, unspecified: Secondary | ICD-10-CM | POA: Insufficient documentation

## 2011-09-11 DIAGNOSIS — Y92009 Unspecified place in unspecified non-institutional (private) residence as the place of occurrence of the external cause: Secondary | ICD-10-CM | POA: Insufficient documentation

## 2011-09-11 MED ORDER — LACTATED RINGERS IV BOLUS (SEPSIS): 500.0000 mL | Freq: Once | INTRAVENOUS | Status: DC

## 2011-09-11 MED ORDER — CYCLOBENZAPRINE HCL 10 MG PO TABS: 10.0000 mg | ORAL_TABLET | Freq: Three times a day (TID) | ORAL | Status: DC | PRN

## 2011-09-11 MED ORDER — ONDANSETRON HCL 4 MG/2ML IJ SOLN
4.0000 mg | Freq: Four times a day (QID) | INTRAMUSCULAR | Status: DC | PRN
  Administered 2011-09-11: 4 mg via INTRAVENOUS
  Filled 2011-09-11: qty 2

## 2011-09-11 MED ORDER — ACETAMINOPHEN 325 MG PO TABS: 650.0000 mg | ORAL_TABLET | Freq: Four times a day (QID) | ORAL | Status: DC | PRN

## 2011-09-11 NOTE — Progress Notes (Signed)
NST only. Pt needed Complete U/S and BPP due to fall on last pm. Pt was sent to [facility] due to no openings in the office. Pt is to also see CNM Earl Gala on call as well for evaluation. [name] [name]
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
## 2011-09-11 NOTE — Progress Notes (Signed)
[redacted]w[redacted]d Unable to void.  Pt c/o falling in shower last night. States that she is having a lot of pressure and decreased fetal movement.

## 2011-09-11 NOTE — Progress Notes (Signed)
Reports fall in the tub last night and decreased fetal movement. NST non-reactive. Unable to perform BPP due to office schedule. Pt sent to MAU

## 2011-09-11 NOTE — MAU Note (Signed)
Fell, slipped in tub last pm.  C/O lower abdomen pain, increased with movement and walking.  Also C/O back pain, headache and decreased fetal movement.  Seen at office and had U/S.

## 2011-09-17 ENCOUNTER — Encounter: Payer: Self-pay | Admitting: Obstetrics and Gynecology

## 2011-09-17 ENCOUNTER — Ambulatory Visit (INDEPENDENT_AMBULATORY_CARE_PROVIDER_SITE_OTHER): Payer: Medicaid Other | Admitting: Obstetrics and Gynecology

## 2011-09-17 VITALS — BP 112/60 | Wt 183.0 lb

## 2011-09-17 DIAGNOSIS — O368190 Decreased fetal movements, unspecified trimester, not applicable or unspecified: Secondary | ICD-10-CM

## 2011-09-17 DIAGNOSIS — Z331 Pregnant state, incidental: Secondary | ICD-10-CM

## 2011-09-17 NOTE — Progress Notes (Signed)
[redacted]w[redacted]d NST reactive Contractions every 7 minutes this am  5-6/10  No LOF  No bleeding

## 2011-09-17 NOTE — Progress Notes (Signed)
[redacted]w[redacted]d pt c/o decreased fetal movement  Request cervix check.

## 2011-09-18 ENCOUNTER — Encounter (HOSPITAL_COMMUNITY): Payer: Self-pay

## 2011-09-18 ENCOUNTER — Inpatient Hospital Stay (HOSPITAL_COMMUNITY)
Admission: AD | Admit: 2011-09-18 | Discharge: 2011-09-18 | Disposition: A | Discharge status: Home / Self Care | Payer: Medicaid Other | Source: Ambulatory Visit | Attending: Obstetrics and Gynecology | Admitting: Obstetrics and Gynecology

## 2011-09-18 ENCOUNTER — Telehealth: Payer: Self-pay | Admitting: Obstetrics and Gynecology

## 2011-09-18 DIAGNOSIS — O99820 Streptococcus B carrier state complicating pregnancy: Secondary | ICD-10-CM

## 2011-09-18 DIAGNOSIS — O479 False labor, unspecified: Secondary | ICD-10-CM | POA: Insufficient documentation

## 2011-09-18 HISTORY — DX: Accidental discharge from unspecified firearms or gun, initial encounter: W34.00XA

## 2011-09-18 NOTE — Telephone Encounter (Signed)
BONNIE/OB/CONTRAC

## 2011-09-18 NOTE — MAU Note (Signed)
Patient is in for labor eval. She states that ctx are q46m but more intense. She denies lof or vaginal bleeding. Reports good fetal movement.

## 2011-09-18 NOTE — Telephone Encounter (Signed)
Spoke with pt rgd msg. Pt stated that she is [redacted]w[redacted]d. Been having contractions every 5 min apart. Pt stated she has been having pressure but more pain . On a scale 1-10 pt stated her pain is 5 to 6 . + fm ,+edema ,+ fluid . Advised pt would consult with [facility] the mid-wife oncall. Per Geraldine Contras advised to to tell the pt to come to MAU for evaluation. Pt's voice understanding. bt cma
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
## 2011-09-24 ENCOUNTER — Encounter: Payer: Self-pay | Admitting: Obstetrics and Gynecology

## 2011-09-24 ENCOUNTER — Ambulatory Visit (INDEPENDENT_AMBULATORY_CARE_PROVIDER_SITE_OTHER): Payer: Medicaid Other | Admitting: Obstetrics and Gynecology

## 2011-09-24 VITALS — BP 98/60 | Wt 181.0 lb

## 2011-09-24 DIAGNOSIS — Z331 Pregnant state, incidental: Secondary | ICD-10-CM

## 2011-09-24 NOTE — Progress Notes (Signed)
Pt states no concerns other than still being pregnant, requests cervix check.

## 2011-09-24 NOTE — Patient Instructions (Signed)
Fetal Movement Counts Patient Name: __________________________________________________ Patient Due Date: ____________________ Kick counts is highly recommended in high risk pregnancies, but it is a good idea for every pregnant woman to do. Start counting fetal movements at 28 weeks of the pregnancy. Fetal movements increase after eating a full meal or eating or drinking something sweet (the blood sugar is higher). It is also important to drink plenty of fluids (well hydrated) before doing the count. Lie on your left side because it helps with the circulation or you can sit in a comfortable chair with your arms over your belly (abdomen) with no distractions around you. DOING THE COUNT  Try to do the count the same time of day each time you do it.   Mark the day and time, then see how long it takes for you to feel 10 movements (kicks, flutters, swishes, rolls). You should have at least 10 movements within 2 hours. You will most likely feel 10 movements in much less than 2 hours. If you do not, wait an hour and count again. After a couple of days you will see a pattern.   What you are looking for is a change in the pattern or not enough counts in 2 hours. Is it taking longer in time to reach 10 movements?  SEEK MEDICAL CARE IF:  You feel less than 10 counts in 2 hours. Tried twice.   No movement in one hour.   The pattern is changing or taking longer each day to reach 10 counts in 2 hours.   You feel the baby is not moving as it usually does.  Date: ____________ Movements: ____________ Start time: ____________ Finish time: ____________  Date: ____________ Movements: ____________ Start time: ____________ Finish time: ____________ Date: ____________ Movements: ____________ Start time: ____________ Finish time: ____________ Date: ____________ Movements: ____________ Start time: ____________ Finish time: ____________ Date: ____________ Movements: ____________ Start time: ____________ Finish time:  ____________ Date: ____________ Movements: ____________ Start time: ____________ Finish time: ____________ Date: ____________ Movements: ____________ Start time: ____________ Finish time: ____________ Date: ____________ Movements: ____________ Start time: ____________ Finish time: ____________  Date: ____________ Movements: ____________ Start time: ____________ Finish time: ____________ Date: ____________ Movements: ____________ Start time: ____________ Finish time: ____________ Date: ____________ Movements: ____________ Start time: ____________ Finish time: ____________ Date: ____________ Movements: ____________ Start time: ____________ Finish time: ____________ Date: ____________ Movements: ____________ Start time: ____________ Finish time: ____________ Date: ____________ Movements: ____________ Start time: ____________ Finish time: ____________ Date: ____________ Movements: ____________ Start time: ____________ Finish time: ____________  Date: ____________ Movements: ____________ Start time: ____________ Finish time: ____________ Date: ____________ Movements: ____________ Start time: ____________ Finish time: ____________ Date: ____________ Movements: ____________ Start time: ____________ Finish time: ____________ Date: ____________ Movements: ____________ Start time: ____________ Finish time: ____________ Date: ____________ Movements: ____________ Start time: ____________ Finish time: ____________ Date: ____________ Movements: ____________ Start time: ____________ Finish time: ____________ Date: ____________ Movements: ____________ Start time: ____________ Finish time: ____________  Date: ____________ Movements: ____________ Start time: ____________ Finish time: ____________ Date: ____________ Movements: ____________ Start time: ____________ Finish time: ____________ Date: ____________ Movements: ____________ Start time: ____________ Finish time: ____________ Date: ____________ Movements:  ____________ Start time: ____________ Finish time: ____________ Date: ____________ Movements: ____________ Start time: ____________ Finish time: ____________ Date: ____________ Movements: ____________ Start time: ____________ Finish time: ____________ Date: ____________ Movements: ____________ Start time: ____________ Finish time: ____________  Date: ____________ Movements: ____________ Start time: ____________ Finish time: ____________ Date: ____________ Movements: ____________ Start time: ____________ Finish time: ____________ Date: ____________ Movements: ____________ Start time:   ____________ Finish time: ____________ Date: ____________ Movements: ____________ Start time: ____________ Finish time: ____________ Date: ____________ Movements: ____________ Start time: ____________ Finish time: ____________ Date: ____________ Movements: ____________ Start time: ____________ Finish time: ____________ Date: ____________ Movements: ____________ Start time: ____________ Finish time: ____________  Date: ____________ Movements: ____________ Start time: ____________ Finish time: ____________ Date: ____________ Movements: ____________ Start time: ____________ Finish time: ____________ Date: ____________ Movements: ____________ Start time: ____________ Finish time: ____________ Date: ____________ Movements: ____________ Start time: ____________ Finish time: ____________ Date: ____________ Movements: ____________ Start time: ____________ Finish time: ____________ Date: ____________ Movements: ____________ Start time: ____________ Finish time: ____________ Date: ____________ Movements: ____________ Start time: ____________ Finish time: ____________  Date: ____________ Movements: ____________ Start time: ____________ Finish time: ____________ Date: ____________ Movements: ____________ Start time: ____________ Finish time: ____________ Date: ____________ Movements: ____________ Start time: ____________ Finish  time: ____________ Date: ____________ Movements: ____________ Start time: ____________ Finish time: ____________ Date: ____________ Movements: ____________ Start time: ____________ Finish time: ____________ Date: ____________ Movements: ____________ Start time: ____________ Finish time: ____________ Date: ____________ Movements: ____________ Start time: ____________ Finish time: ____________  Date: ____________ Movements: ____________ Start time: ____________ Finish time: ____________ Date: ____________ Movements: ____________ Start time: ____________ Finish time: ____________ Date: ____________ Movements: ____________ Start time: ____________ Finish time: ____________ Date: ____________ Movements: ____________ Start time: ____________ Finish time: ____________ Date: ____________ Movements: ____________ Start time: ____________ Finish time: ____________ Date: ____________ Movements: ____________ Start time: ____________ Finish time: ____________ Document Released: 01/24/2006 Document Revised: 12/14/2010 Document Reviewed: 07/27/2008 [employer] Patient Information 2012 [employer]. 

## 2011-09-24 NOTE — Progress Notes (Signed)
A/P GBS done to get sensitivites.  Pt positive and allergic to PCN Fetal kick counts reviewed.  NST today pt c/o decrease fetal movement Labor reviewed with pt All patients  questions answered

## 2011-09-25 ENCOUNTER — Encounter (HOSPITAL_COMMUNITY): Payer: Self-pay | Admitting: *Deleted

## 2011-09-25 ENCOUNTER — Telehealth (HOSPITAL_COMMUNITY): Payer: Self-pay | Admitting: *Deleted

## 2011-09-25 ENCOUNTER — Telehealth: Payer: Self-pay | Admitting: Obstetrics and Gynecology

## 2011-09-25 NOTE — Telephone Encounter (Signed)
Preadmission screen  

## 2011-09-25 NOTE — Telephone Encounter (Signed)
Induction scheduled for 10/03/11 @ 7:30pm with SL/AR. -Adrianne Pridgen

## 2011-09-26 ENCOUNTER — Encounter (HOSPITAL_COMMUNITY): Payer: Self-pay | Admitting: Family

## 2011-09-26 ENCOUNTER — Inpatient Hospital Stay (HOSPITAL_COMMUNITY)
Admission: AD | Admit: 2011-09-26 | Discharge: 2011-09-26 | Disposition: A | Discharge status: Home / Self Care | Payer: Medicaid Other | Source: Ambulatory Visit | Attending: Obstetrics and Gynecology | Admitting: Obstetrics and Gynecology

## 2011-09-26 ENCOUNTER — Telehealth: Payer: Self-pay | Admitting: Obstetrics and Gynecology

## 2011-09-26 DIAGNOSIS — O368190 Decreased fetal movements, unspecified trimester, not applicable or unspecified: Secondary | ICD-10-CM

## 2011-09-26 DIAGNOSIS — O99820 Streptococcus B carrier state complicating pregnancy: Secondary | ICD-10-CM

## 2011-09-26 DIAGNOSIS — O479 False labor, unspecified: Secondary | ICD-10-CM | POA: Insufficient documentation

## 2011-09-26 NOTE — MAU Note (Signed)
Patient is in for labor eval. She c/o ctx q59m but intense and pressure. Reports decreased fetal movement (one movement sicne 10am).

## 2011-09-26 NOTE — Telephone Encounter (Signed)
Reviewed s/s uc, srom, vag bleeding, daily fetal kick counts to report, comfort measures. Encouragged 8 water daily and frequent voids. May use benadryl or has rx flexeril for sleep. Lavera Guise, CNM
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
## 2011-09-26 NOTE — MAU Note (Signed)
Pt states she lost mucous plug at 0300 today; having bloody show, lots of pressure. Membranes stripped on Monday's visit.

## 2011-09-26 NOTE — MAU Note (Signed)
Patient states she has been having contraction every 7 minutes with stringy mucus. Reports only one fetal movement this am. Fetal heart rate in triage 140's.

## 2011-09-26 NOTE — Telephone Encounter (Signed)
Returned pt call regarding onset of labor. Pt called complaining of losing mucous plug. Having contractions every 5-7 min. Pt stated that she had spoken to [facility] early this am. Pt was advised to monitor contractions and to call the office for advise. CNM Vickie instructed pt to go to MAU, CNM DD was called. Mathis Bud

## 2011-09-27 ENCOUNTER — Telehealth: Payer: Self-pay | Admitting: Obstetrics and Gynecology

## 2011-09-27 ENCOUNTER — Encounter (HOSPITAL_COMMUNITY): Payer: Self-pay | Admitting: Anesthesiology

## 2011-09-27 ENCOUNTER — Encounter (HOSPITAL_COMMUNITY): Payer: Self-pay | Admitting: *Deleted

## 2011-09-27 ENCOUNTER — Inpatient Hospital Stay (HOSPITAL_COMMUNITY): Payer: Medicaid Other | Admitting: Anesthesiology

## 2011-09-27 ENCOUNTER — Inpatient Hospital Stay (HOSPITAL_COMMUNITY)
Admission: AD | Admit: 2011-09-27 | Discharge: 2011-09-30 | DRG: 775 | Disposition: A | Discharge status: Home / Self Care | Payer: Medicaid Other | Source: Ambulatory Visit | Attending: Obstetrics and Gynecology | Admitting: Obstetrics and Gynecology

## 2011-09-27 DIAGNOSIS — Z22330 Carrier of Group B streptococcus: Secondary | ICD-10-CM

## 2011-09-27 DIAGNOSIS — Z880 Allergy status to penicillin: Secondary | ICD-10-CM

## 2011-09-27 DIAGNOSIS — O99892 Other specified diseases and conditions complicating childbirth: Secondary | ICD-10-CM | POA: Diagnosis present

## 2011-09-27 DIAGNOSIS — O99820 Streptococcus B carrier state complicating pregnancy: Secondary | ICD-10-CM

## 2011-09-27 DIAGNOSIS — O9903 Anemia complicating the puerperium: Secondary | ICD-10-CM | POA: Diagnosis not present

## 2011-09-27 DIAGNOSIS — D649 Anemia, unspecified: Secondary | ICD-10-CM | POA: Diagnosis not present

## 2011-09-27 LAB — CBC
HCT: 34.4 % — ABNORMAL LOW (ref 36.0–46.0)
MCV: 89.6 fL (ref 78.0–100.0)
RBC: 3.84 MIL/uL — ABNORMAL LOW (ref 3.87–5.11)
RDW: 13.5 % (ref 11.5–15.5)
WBC: 9.9 10*3/uL (ref 4.0–10.5)

## 2011-09-27 LAB — CULTURE, BETA STREP (GROUP B ONLY)

## 2011-09-27 MED ORDER — PHENYLEPHRINE 40 MCG/ML (10ML) SYRINGE FOR IV PUSH (FOR BLOOD PRESSURE SUPPORT): 80.0000 ug | PREFILLED_SYRINGE | INTRAVENOUS | Status: DC | PRN

## 2011-09-27 MED ORDER — ONDANSETRON HCL 4 MG/2ML IJ SOLN: 4.0000 mg | Freq: Four times a day (QID) | INTRAMUSCULAR | Status: DC | PRN

## 2011-09-27 MED ORDER — DIPHENHYDRAMINE HCL 50 MG/ML IJ SOLN: 12.5000 mg | INTRAMUSCULAR | Status: DC | PRN

## 2011-09-27 MED ORDER — FENTANYL 2.5 MCG/ML BUPIVACAINE 1/10 % EPIDURAL INFUSION (WH - ANES)
14.0000 mL/h | INTRAMUSCULAR | Status: DC
  Administered 2011-09-27 – 2011-09-28 (×4): 14 mL/h via EPIDURAL
  Filled 2011-09-27 (×3): qty 60

## 2011-09-27 MED ORDER — IBUPROFEN 600 MG PO TABS: 600.0000 mg | ORAL_TABLET | Freq: Four times a day (QID) | ORAL | Status: DC | PRN

## 2011-09-27 MED ORDER — OXYCODONE-ACETAMINOPHEN 5-325 MG PO TABS: 1.0000 | ORAL_TABLET | ORAL | Status: DC | PRN

## 2011-09-27 MED ORDER — LACTATED RINGERS IV SOLN: INTRAVENOUS | Status: DC

## 2011-09-27 MED ORDER — FENTANYL 2.5 MCG/ML BUPIVACAINE 1/10 % EPIDURAL INFUSION (WH - ANES)
INTRAMUSCULAR | Status: AC
  Filled 2011-09-27: qty 60

## 2011-09-27 MED ORDER — PHENYLEPHRINE 40 MCG/ML (10ML) SYRINGE FOR IV PUSH (FOR BLOOD PRESSURE SUPPORT)
PREFILLED_SYRINGE | INTRAVENOUS | Status: AC
  Filled 2011-09-27: qty 5

## 2011-09-27 MED ORDER — EPHEDRINE 5 MG/ML INJ
INTRAVENOUS | Status: AC
  Filled 2011-09-27: qty 4

## 2011-09-27 MED ORDER — TERBUTALINE SULFATE 1 MG/ML IJ SOLN: 0.2500 mg | Freq: Once | INTRAMUSCULAR | Status: AC | PRN

## 2011-09-27 MED ORDER — OXYTOCIN BOLUS FROM INFUSION
500.0000 mL | Freq: Once | INTRAVENOUS | Status: AC
  Administered 2011-09-28: 500 mL via INTRAVENOUS
  Filled 2011-09-27: qty 500

## 2011-09-27 MED ORDER — CITRIC ACID-SODIUM CITRATE 334-500 MG/5ML PO SOLN
30.0000 mL | ORAL | Status: DC | PRN
  Filled 2011-09-27: qty 15

## 2011-09-27 MED ORDER — OXYTOCIN 40 UNITS IN LACTATED RINGERS INFUSION - SIMPLE MED
1.0000 m[IU]/min | INTRAVENOUS | Status: DC
  Administered 2011-09-27: 4 m[IU]/min via INTRAVENOUS
  Administered 2011-09-27: 2 m[IU]/min via INTRAVENOUS
  Administered 2011-09-27: 6 m[IU]/min via INTRAVENOUS
  Filled 2011-09-27: qty 1000

## 2011-09-27 MED ORDER — LACTATED RINGERS IV SOLN: 500.0000 mL | INTRAVENOUS | Status: DC | PRN

## 2011-09-27 MED ORDER — EPHEDRINE 5 MG/ML INJ: 10.0000 mg | INTRAVENOUS | Status: DC | PRN

## 2011-09-27 MED ORDER — LIDOCAINE HCL (PF) 1 % IJ SOLN
INTRAMUSCULAR | Status: DC | PRN
  Administered 2011-09-27 (×2): 5 mL

## 2011-09-27 MED ORDER — ACETAMINOPHEN 325 MG PO TABS: 650.0000 mg | ORAL_TABLET | ORAL | Status: DC | PRN

## 2011-09-27 MED ORDER — LACTATED RINGERS IV SOLN: 500.0000 mL | Freq: Once | INTRAVENOUS | Status: DC

## 2011-09-27 MED ORDER — LIDOCAINE HCL (PF) 1 % IJ SOLN
30.0000 mL | INTRAMUSCULAR | Status: DC | PRN
  Filled 2011-09-27: qty 30

## 2011-09-27 MED ORDER — OXYTOCIN 40 UNITS IN LACTATED RINGERS INFUSION - SIMPLE MED: 62.5000 mL/h | Freq: Once | INTRAVENOUS | Status: DC

## 2011-09-27 MED ORDER — FENTANYL CITRATE 0.05 MG/ML IJ SOLN
100.0000 ug | INTRAMUSCULAR | Status: DC | PRN
  Administered 2011-09-27 (×2): 100 ug via INTRAVENOUS
  Filled 2011-09-27 (×2): qty 2

## 2011-09-27 MED ORDER — CLINDAMYCIN PHOSPHATE 900 MG/50ML IV SOLN
900.0000 mg | Freq: Three times a day (TID) | INTRAVENOUS | Status: DC
  Administered 2011-09-27 – 2011-09-28 (×3): 900 mg via INTRAVENOUS
  Filled 2011-09-27 (×5): qty 50
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
## 2011-09-27 NOTE — Telephone Encounter (Signed)
NANCY/OB/IN LABOR
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
## 2011-09-27 NOTE — Telephone Encounter (Signed)
TC from pt. States having contractions q 3-4 min.  Unable to talk through them.  Leaking fluid since 8AM when had bright red bleeding.  Last aware of FM this AM.  Has not tried to call earlier.  Per DD to MAU. Pt verbalizes comprehension.

## 2011-09-27 NOTE — Progress Notes (Signed)
Patient ID: [name] [name], female   DOB: 07/19/1980, 31 y.o.   MRN: [id number] .Subjective: Pt breathing through, tense, states earlier dose of fentanyl helped quite a lot, was having back pain, now more in front, family at bs, declines epidural at present    Objective: BP 103/62  Pulse 86  Temp 98 F (36.7 C) (Oral)  Resp 18  Ht 5\' 3"  (1.6 m)  Wt 179 lb (81.194 kg)  BMI 31.71 kg/m2  LMP 12/20/2010   FHT:  FHR: 130 bpm, variability: moderate,  accelerations:  Present,  decelerations:  Present occ mild early variables UC:   irregular, every 4-6 minutes SVE:   Dilation: 4 Effacement (%): 100 Station: -1 Exam by:: S. Destani Wamser, CNM  Pitocin on 8mu forebag noted, AROM lg amt blood tinged clear fluid  Assessment / Plan: Augmentation of labor, progressing well GBS pos, rcv'd clindamycin   Fetal Wellbeing:  Category I Pain Control:  Fentanyl  Update physician PRN  [name] 09/27/2011, 10:42 PM

## 2011-09-27 NOTE — Anesthesia Procedure Notes (Signed)
Epidural Patient location during procedure: OB Start time: 09/27/2011 11:48 PM  Staffing Anesthesiologist: Brayton Caves R Performed by: anesthesiologist   Preanesthetic Checklist Completed: patient identified, site marked, surgical consent, pre-op evaluation, timeout performed, IV checked, risks and benefits discussed and monitors and equipment checked  Epidural Patient position: sitting Prep: site prepped and draped and DuraPrep Patient monitoring: continuous pulse ox and blood pressure Approach: midline Injection technique: LOR air and LOR saline  Needle:  Needle type: Tuohy  Needle gauge: 17 G Needle length: 9 cm and 9 Needle insertion depth: 5 cm cm Catheter type: closed end flexible Catheter size: 19 Gauge Catheter at skin depth: 10 cm Test dose: negative  Assessment Events: blood not aspirated, injection not painful, no injection resistance, negative IV test and no paresthesia  Additional Notes Patient identified.  Risk benefits discussed including failed block, incomplete pain control, headache, nerve damage, paralysis, blood pressure changes, nausea, vomiting, reactions to medication both toxic or allergic, and postpartum back pain.  Patient expressed understanding and wished to proceed.  All questions were answered.  Sterile technique used throughout procedure and epidural site dressed with sterile barrier dressing. No paresthesia or other complications noted.The patient did not experience any signs of intravascular injection such as tinnitus or metallic taste in mouth nor signs of intrathecal spread such as rapid motor block. Please see nursing notes for vital signs.

## 2011-09-27 NOTE — MAU Note (Signed)
Pt presents with complaint of ROM at 0800, contractions all day.

## 2011-09-27 NOTE — Progress Notes (Signed)
Contracting 1: 6 mins and now augmented with pitocin and contraction pattern 1: 3 - 4 mins O VSS BP 109/77  Pulse 91  Temp 97.4 F (36.3 C) (Oral)  Resp 18  Ht 5\' 3"  (1.6 m)  Wt 179 lb (81.194 kg)  BMI 31.71 kg/m2  LMP 12/20/2010       fhts category 1 baseline 145 bpm      abd soft between uc      Contractions 1 : 3 - mins      Vag : 4/90%/-2       IV Clindamycin 900mg s Q 8hrly A   Coping but requested IV analagesia Fentanyl now P   continue care and augmentation with Pitocin.  Earl Gala, CNM.

## 2011-09-28 ENCOUNTER — Encounter (HOSPITAL_COMMUNITY): Payer: Self-pay | Admitting: *Deleted

## 2011-09-28 MED ORDER — BENZOCAINE-MENTHOL 20-0.5 % EX AERO
1.0000 "application " | INHALATION_SPRAY | CUTANEOUS | Status: DC | PRN
  Filled 2011-09-28: qty 56

## 2011-09-28 MED ORDER — ONDANSETRON HCL 4 MG/2ML IJ SOLN: 4.0000 mg | INTRAMUSCULAR | Status: DC | PRN

## 2011-09-28 MED ORDER — DIBUCAINE 1 % RE OINT: 1.0000 "application " | TOPICAL_OINTMENT | RECTAL | Status: DC | PRN

## 2011-09-28 MED ORDER — WITCH HAZEL-GLYCERIN EX PADS: 1.0000 "application " | MEDICATED_PAD | CUTANEOUS | Status: DC | PRN

## 2011-09-28 MED ORDER — OXYCODONE-ACETAMINOPHEN 5-325 MG PO TABS
1.0000 | ORAL_TABLET | ORAL | Status: DC | PRN
  Administered 2011-09-28 – 2011-09-29 (×4): 1 via ORAL
  Administered 2011-09-29 (×2): 2 via ORAL
  Administered 2011-09-30 (×2): 1 via ORAL
  Administered 2011-09-30: 2 via ORAL
  Filled 2011-09-28: qty 1
  Filled 2011-09-28: qty 2
  Filled 2011-09-28 (×3): qty 1
  Filled 2011-09-28 (×2): qty 2
  Filled 2011-09-28 (×3): qty 1

## 2011-09-28 MED ORDER — SIMETHICONE 80 MG PO CHEW: 80.0000 mg | CHEWABLE_TABLET | ORAL | Status: DC | PRN

## 2011-09-28 MED ORDER — LANOLIN HYDROUS EX OINT: TOPICAL_OINTMENT | CUTANEOUS | Status: DC | PRN

## 2011-09-28 MED ORDER — OXYTOCIN 40 UNITS IN LACTATED RINGERS INFUSION - SIMPLE MED: 1.0000 m[IU]/min | INTRAVENOUS | Status: DC

## 2011-09-28 MED ORDER — ACETAMINOPHEN 500 MG PO TABS
1000.0000 mg | ORAL_TABLET | Freq: Once | ORAL | Status: AC
  Administered 2011-09-28: 1000 mg via ORAL
  Filled 2011-09-28: qty 2

## 2011-09-28 MED ORDER — TERBUTALINE SULFATE 1 MG/ML IJ SOLN: 0.2500 mg | Freq: Once | INTRAMUSCULAR | Status: DC | PRN

## 2011-09-28 MED ORDER — IBUPROFEN 600 MG PO TABS
600.0000 mg | ORAL_TABLET | Freq: Four times a day (QID) | ORAL | Status: DC
  Administered 2011-09-28 – 2011-09-30 (×10): 600 mg via ORAL
  Filled 2011-09-28 (×10): qty 1

## 2011-09-28 MED ORDER — DIPHENHYDRAMINE HCL 25 MG PO CAPS: 25.0000 mg | ORAL_CAPSULE | Freq: Four times a day (QID) | ORAL | Status: DC | PRN

## 2011-09-28 MED ORDER — TETANUS-DIPHTH-ACELL PERTUSSIS 5-2.5-18.5 LF-MCG/0.5 IM SUSP
0.5000 mL | Freq: Once | INTRAMUSCULAR | Status: AC
  Administered 2011-09-29: 0.5 mL via INTRAMUSCULAR

## 2011-09-28 MED ORDER — SENNOSIDES-DOCUSATE SODIUM 8.6-50 MG PO TABS
2.0000 | ORAL_TABLET | Freq: Every day | ORAL | Status: DC
  Administered 2011-09-28 – 2011-09-29 (×2): 2 via ORAL

## 2011-09-28 MED ORDER — MEASLES, MUMPS & RUBELLA VAC ~~LOC~~ INJ
0.5000 mL | INJECTION | Freq: Once | SUBCUTANEOUS | Status: DC
  Filled 2011-09-28: qty 0.5

## 2011-09-28 MED ORDER — ONDANSETRON HCL 4 MG PO TABS: 4.0000 mg | ORAL_TABLET | ORAL | Status: DC | PRN

## 2011-09-28 MED ORDER — ZOLPIDEM TARTRATE 5 MG PO TABS: 5.0000 mg | ORAL_TABLET | Freq: Every evening | ORAL | Status: DC | PRN

## 2011-09-28 MED ORDER — PRENATAL MULTIVITAMIN CH
1.0000 | ORAL_TABLET | Freq: Every day | ORAL | Status: DC
  Administered 2011-09-28 – 2011-09-30 (×3): 1 via ORAL
  Filled 2011-09-28 (×3): qty 1

## 2011-09-28 NOTE — Progress Notes (Signed)
Patient ID: [name] [name], female   DOB: 1980/04/20, 31 y.o.   MRN: [id number] .Subjective:  Pt has been resting, denies pain w ctx, occ pressure  Objective: BP 95/56  Pulse 82  Temp 98.4 F (36.9 C) (Oral)  Resp 18  Ht 5\' 3"  (1.6 m)  Wt 179 lb (81.194 kg)  BMI 31.71 kg/m2  SpO2 100%  LMP 12/20/2010   FHT:  FHR: 140 bpm, variability: moderate,  accelerations:  Present,  decelerations:  Present variables, mild-mod,  UC:   irregular, every 2-4 minutes SVE:   Dilation: 7 Effacement (%): 100 Station: -1 Exam by:: S.Janard Culp,CNM   MVU's 90-125  Assessment / Plan: ?OP, asynclitic  Will restart pitocin 1mu and titrate for adequate MVU's Continue amnioinfusion     Fetal Wellbeing:  Category II Pain Control:  Epidural  Update physician PRN  [name] 09/28/2011, 5:02 AM

## 2011-09-28 NOTE — Progress Notes (Signed)
Comfortable, some pressure O VSS      fht s140s LTV mod severe variables nonrepetitve to 60s      abd soft between uc q 2-4      Contractions inadequate prolonged      Vag not assessed A 2nd stage P bolus 300 ml amnioinfusion, repositioned, discussed efm strip variables, possibility of emergency cesarean section, continue care Lavera Guise, CNM

## 2011-09-28 NOTE — Progress Notes (Signed)
UR Chart review completed.  

## 2011-09-28 NOTE — Progress Notes (Signed)
Patient ID: [name] [name], female   DOB: January 28, 1980, 31 y.o.   MRN: [id number] Operative Delivery Note At 11:00 AM a viable female was delivered via Vaginal, Spontaneous Delivery.  Presentation: vertex; Position: Left,, Occiput,, Anterior; Station: +3.  Verbal consent: obtained from patient.  Risks and benefits discussed in detail.  Risks include, but are not limited to the risks of anesthesia, bleeding, infection, damage to maternal tissues, fetal cephalhematoma.  There is also the risk of inability to effect vaginal delivery of the head, or shoulder dystocia that cannot be resolved by established maneuvers, leading to the need for emergency cesarean section. Pt chose the vacuum.  The IUPC and FSE was removed without difficulty.  Vacuum was placed in the correct postion.  The head delivered with one pull in the green zone.  The suction was released.  The body was delivered without difficulty. The cord was clamped and cut.  Two second degree suchal lacerations repaired with 2-0 chromic.  EBL 300 cc  APGAR: 4, 7 ; weight .   Placenta status: Intact, Spontaneous.   Cord:  with the following complications: .  Cord pH: arterial and venous both 7.2  Anesthesia:   Instruments: vacuum Episiotomy: none Lacerations:  Two sucal lacerations Suture Repair: 2.0 chromic Est. Blood Loss (mL):   Mom to postpartum.  Baby to NICU.  Fairy Ashlock A 09/28/2011, 11:41 AM

## 2011-09-28 NOTE — Progress Notes (Signed)
Patient ID: [name] [name], female   DOB: 1980-05-24, 31 y.o.   MRN: [id number] .Subjective:  Pt has been resting some, c/o pelvic pressure w ctx  Objective: BP 97/59  Pulse 94  Temp 98.9 F (37.2 C) (Oral)  Resp 18  Ht 5\' 3"  (1.6 m)  Wt 179 lb (81.194 kg)  BMI 31.71 kg/m2  SpO2 100%  LMP 12/20/2010   FHT:  FHR: 140 bpm, variability: moderate,  accelerations:  Present,  decelerations:  Present persistent early variables, not w every ctx, some are to 60's over 20-30 sec UC:   irregular, every 1-5 minutes, coupling noted  SVE:   5.5/100/-1    Assessment / Plan: early/active labor GBS pos, has rcv'd clindamycin Mild cervical change, tho ctx pattern not adequate,  Pt warm to touch, afrebile  Will d/c pitocin Begin amnioinfusion Give tylenol PO CTO closely, consider restarting pitocin after 104min-1hr   Fetal Wellbeing:  Category II Pain Control:  Epidural  Update physician PRN  [name] 09/28/2011, 2:21 AM

## 2011-09-28 NOTE — Progress Notes (Signed)
Patient ID: [name] [name], female   DOB: February 27, 1980, 31 y.o.   MRN: [id number] .Subjective:  Pt c/o R sided back pain and pressure w ctx, breathing and moaning   Objective: BP 109/76  Pulse 113  Temp 98.9 F (37.2 C) (Oral)  Resp 18  Ht 5\' 3"  (1.6 [name])  Wt 179 lb (81.194 kg)  BMI 31.71 kg/m2  SpO2 100%  LMP 12/20/2010   FHT:  FHR: 140 bpm, variability: moderate,  accelerations:  Present,  decelerations:  Present mod-severe variables, nadir 60bpm UC:   regular, every 2-3 minutes SVE:   Dilation: 10 Effacement (%): 100 Station: 0 Exam by:: S.Abdirahman Chittum,CNM    Assessment / Plan: 2nd stage D/w Dr Pennie Rushing, will allow for laboring down Epidural PCA utilized Position changes   Fetal Wellbeing:  Category II Pain Control:  Epidural    [name] [name] 09/28/2011, 7:31 AM

## 2011-09-28 NOTE — Progress Notes (Signed)
Patient ID: [name] [name], female   DOB: 02/27/1980, 31 y.o.   MRN: [id number] .Subjective: Pt getting comfortable w epidural, has been very tense   Objective: BP 112/79  Pulse 85  Temp 98.5 F (36.9 C) (Axillary)  Resp 18  Ht 5\' 3"  (1.6 m)  Wt 179 lb (81.194 kg)  BMI 31.71 kg/m2  SpO2 100%  LMP 12/20/2010   FHT:  FHR: 140 bpm, variability: moderate,  accelerations:  Present,  decelerations:  Present mild - mod early variables, accels 10x10 UC:   irregular, every 2-4 minutes SVE:   Dilation: 4 Effacement (%): 100 Station: -1 Exam by:: S. Jamerica Snavely, CNM  Pitocin at 6mu No cervical change FSE and IUPC placed without difficulty   Assessment / Plan: Protracted latent phase Will continue to titrate pitocin for adequate MVU's May consider amnioinfusion PRN   Fetal Wellbeing:  Category II Pain Control:  Epidural  Updated Dr Waylan Rocher M 09/28/2011, 1:08 AM

## 2011-09-28 NOTE — Anesthesia Postprocedure Evaluation (Signed)
Anesthesia Post Note  Patient: [name] [name]  Procedure(s) Performed: * No procedures listed *  Anesthesia type: Epidural  Patient location: Mother/Baby  Post pain: Pain level controlled  Post assessment: Post-op Vital signs reviewed  Last Vitals:  Filed Vitals:   09/28/11 1748  BP: 104/60  Pulse: 83  Temp: 36.7 C  Resp: 18    Post vital signs: Reviewed  Level of consciousness:alert  Complications: No apparent anesthesia complications

## 2011-09-28 NOTE — Progress Notes (Signed)
Dr. Normand Sloop in house discussed EFM strip, plan of care and concurs. Lavera Guise, CNM

## 2011-09-28 NOTE — Progress Notes (Signed)
Patient ID: [name] [name], female   DOB: [birth year], 31 y.o.   MRN: [id number] .Subjective:  Remains comfortable, feels some tightening in abdomen   Objective: BP 113/69  Pulse 106  Temp 98.9 F (37.2 C) (Oral)  Resp 18  Ht 5\' 3"  (1.6 m)  Wt 179 lb (81.194 kg)  BMI 31.71 kg/m2  SpO2 100%  LMP 12/20/2010   FHT:  FHR: 140 bpm, variability: moderate,  accelerations:  Present,  decelerations:  Present repetitive variables w nadir 60-70bpm 20-30sec long w good good recovery  UC:   regular, every 2-4 minutes SVE:   Dilation: 9 Effacement (%): 100 Station: 0 Exam by:: S.Malasia Torain,CNM    Assessment / Plan: cervical change and descent of vertex, some caput noted  Pitocin dc'd  Pt changed back to L lateral, had been in HF briefly with more deeper variables noted  Discussed w pt possibility of recommending c/s,  Will continue frequent position changes to facilitate rotation  Fetal Wellbeing:  Category II Pain Control:  Epidural  Update physician PRN  [name] 09/28/2011, 5:43 AM

## 2011-09-28 NOTE — Progress Notes (Signed)
Comfortable, some pressure  O VSS fht s140s LTV mod severe variables nonrepetitve to 60s  abd soft between uc q 2-4  Contractions inadequate prolonged  Vag not assessed  A 2nd stage  P 0800 Dr. Normand Sloop called updated on pt status with labor down plan, [facility] pt pushing down hall, will start pushing in this room after that delivery, continue care  Lavera Guise, CNM

## 2011-09-28 NOTE — Progress Notes (Signed)
Comfortable, not feeling uc O BP 111/62  Pulse 89  Temp 98.5 F (36.9 C) (Oral)  Resp 18  Ht 5\' 3"  (1.6 m)  Wt 179 lb (81.194 kg)  BMI 31.71 kg/m2  SpO2 100%  LMP 12/20/2010  fhts 140s LTV mod severe variable decels nonrepetitive      abd soft between uc      Contractions q 3-4      Vag C 0 capit, molding P pushing now with good maternal effort, continue care Lavera Guise, CNM

## 2011-09-29 LAB — CBC
MCH: 29.9 pg (ref 26.0–34.0)
MCHC: 33.6 g/dL (ref 30.0–36.0)
Platelets: 229 10*3/uL (ref 150–400)
RDW: 13.6 % (ref 11.5–15.5)

## 2011-09-29 LAB — CCBB MATERNAL DONOR DRAW

## 2011-09-29 NOTE — Progress Notes (Signed)
Patient ID: [name] [name], female   DOB: 05-18-1980, 31 y.o.   MRN: [id number] Pt in NICU

## 2011-09-30 MED ORDER — IBUPROFEN 600 MG PO TABS: 600.0000 mg | ORAL_TABLET | Freq: Four times a day (QID) | ORAL | Status: DC | PRN

## 2011-09-30 MED ORDER — FERROUS SULFATE 325 (65 FE) MG PO TABS: 325.0000 mg | ORAL_TABLET | Freq: Every day | ORAL | Status: DC

## 2011-09-30 NOTE — Discharge Summary (Signed)
Physician Discharge Summary  Patient ID: [name] [name] MRN: [id number] DOB/AGE: [birth year] 31 y.o.  Admit date: 09/27/2011 Discharge date: 09/30/2011  Admission Diagnoses: [redacted]w[redacted]d labor   Discharge Diagnoses:  Principal Problem:  *Vacuum extractor delivery, delivered Active Problems:  Penicillin allergy  GBS (group B Streptococcus carrier), +RV culture, currently pregnant Anemia  Discharged Condition: stable  Hospital Course: vacuum delivery by Dr. Normand Sloop, normal involution, lactating, plans nexplanon  Consults: None  Significant Diagnostic Studies: labs:  Hemoglobin & Hematocrit     Component Value Date/Time   HGB 9.3* 09/29/2011 0745   HCT 27.7* 09/29/2011 0745     Treatments: IV antibiotics  Discharge Exam: Blood pressure 125/85, pulse 94, temperature 97.9 F (36.6 C), temperature source Oral, resp. rate 20, height 5\' 3"  (1.6 m), weight 179 lb (81.194 kg), last menstrual period 12/20/2010, SpO2 99.00%, unknown if currently breastfeeding. General appearance: alert, cooperative and no distress S: comfortable, little bleeding, slept     Breast feeding O VSS     abd soft, nt, ff      sm  Flow perineum clean intact bilateral sulcus tears     -Homans sign bilaterally,      No edema A normal involution     Lactating     PP day 2 Disposition: 01-Home or Self Care     Medication List     As of 09/30/2011 10:54 AM    STOP taking these medications         cyclobenzaprine 10 MG tablet   Commonly known as: FLEXERIL      ranitidine 150 MG tablet   Commonly known as: ZANTAC      TAKE these medications         ferrous sulfate 325 (65 FE) MG tablet   Take 1 tablet (325 mg total) by mouth daily.      ibuprofen 600 MG tablet   Commonly known as: ADVIL,MOTRIN   Take 1 tablet (600 mg total) by mouth every 6 (six) hours as needed for pain.      prenatal multivitamin Tabs   Take 1 tablet by mouth daily.           Follow-up Information    Follow up with  [facility]. In 5 weeks.   Contact information:   [address] [place] [address] [place] [address] [phone]       [facility] hand book reviewed, f/o 5 weeks. PNV daily, iron daily, stool softner discussed.  SignedLavera Guise 09/30/2011, 10:54 AM

## 2011-09-30 NOTE — Clinical Social Work Note (Signed)
Clinical Social Work Department PSYCHOSOCIAL ASSESSMENT - MATERNAL/CHILD 09/30/2011  Patient:  [name],[name]  Account Number:  [id number]  Admit Date:  09/27/2011  Childs Name:   [name] [name]    Clinical Social Worker:  Shenetta Schnackenberg, LCSW   Date/Time:  09/30/2011 02:30 PM  Date Referred:  09/30/2011   Referral source  Physician     Referred reason  NICU   Other referral source:    I:  FAMILY / HOME ENVIRONMENT Child's legal guardian:  PARENT  Guardian - Name Guardian - Age Guardian - Address  [name] [name] [address] , [place] [address]  [name] [name]     Other household support members/support persons Name Relationship DOB  first child per MOB     Other support:    II  PSYCHOSOCIAL DATA Information Source:  Patient Interview  Financial and Community Resources Employment:   MOB- Hairstylist  FOB- [name] [name]   Financial resources:  Medicaid If Medicaid - County:  [place] Other  WIC   School / Grade:   Maternity Care Coordinator / Child Services Coordination / Early Interventions:  Cultural issues impacting care:    III  STRENGTHS Strengths  Adequate Resources  Supportive family/friends  Understanding of illness  Compliance with medical plan  Home prepared for Child (including basic supplies)   Strength comment:    IV  RISK FACTORS AND CURRENT PROBLEMS Current Problem:  None   Risk Factor & Current Problem Patient Issue Family Issue Risk Factor / Current Problem Comment   N N     V  SOCIAL WORK ASSESSMENT CSW spoke with MOB alone in room.  SW discussed SW role in NICU and MOB's understanding of illness.  MOB reported appropriate emotions around NICU admission, however no significant concerns, and she expressed understanding of current treatment.  MOB reports not having alot of family support, however she states she has alot of friends who are supportive and cannot wait to meet her baby.  MOB also expressed FOB is supportive, however he  was not able to be here for the delivery which MOB was observably disappointed by that.  SW discussed supplies and any current concerns. MOB did not report any concerns, however asked about pediatrician options.  CSW provided MOB with a pediatrician list.  No hx of drug use and no current concerns that SW needs to address.  CSW will continue to follow while infant is in NICU to provide support.      VI SOCIAL WORK PLAN Social Work Plan  Psychosocial Support/Ongoing Assessment of Needs   Type of pt/family education:   If child protective services report - county:   If child protective services report - date:   Information/referral to community resources comment:   Other social work plan:     

## 2011-10-01 ENCOUNTER — Other Ambulatory Visit: Payer: Medicaid Other

## 2011-10-01 ENCOUNTER — Encounter: Payer: Medicaid Other | Admitting: Obstetrics and Gynecology

## 2011-10-03 ENCOUNTER — Inpatient Hospital Stay (HOSPITAL_COMMUNITY): Admission: RE | Admit: 2011-10-03 | Payer: Medicaid Other | Source: Ambulatory Visit

## 2011-10-05 ENCOUNTER — Observation Stay (HOSPITAL_COMMUNITY)
Admission: AD | Admit: 2011-10-05 | Discharge: 2011-10-07 | Disposition: A | Discharge status: Home / Self Care | Payer: Medicaid Other | Source: Ambulatory Visit | Attending: Obstetrics and Gynecology | Admitting: Obstetrics and Gynecology

## 2011-10-05 ENCOUNTER — Telehealth: Payer: Self-pay | Admitting: Obstetrics and Gynecology

## 2011-10-05 ENCOUNTER — Other Ambulatory Visit: Payer: Self-pay | Admitting: Obstetrics and Gynecology

## 2011-10-05 ENCOUNTER — Encounter (HOSPITAL_COMMUNITY): Payer: Self-pay | Admitting: Obstetrics and Gynecology

## 2011-10-05 ENCOUNTER — Inpatient Hospital Stay (HOSPITAL_COMMUNITY): Payer: Medicaid Other

## 2011-10-05 DIAGNOSIS — R609 Edema, unspecified: Secondary | ICD-10-CM

## 2011-10-05 DIAGNOSIS — R748 Abnormal levels of other serum enzymes: Secondary | ICD-10-CM | POA: Insufficient documentation

## 2011-10-05 DIAGNOSIS — R030 Elevated blood-pressure reading, without diagnosis of hypertension: Secondary | ICD-10-CM | POA: Insufficient documentation

## 2011-10-05 DIAGNOSIS — R079 Chest pain, unspecified: Secondary | ICD-10-CM | POA: Insufficient documentation

## 2011-10-05 DIAGNOSIS — IMO0001 Reserved for inherently not codable concepts without codable children: Secondary | ICD-10-CM

## 2011-10-05 DIAGNOSIS — O99893 Other specified diseases and conditions complicating puerperium: Principal | ICD-10-CM | POA: Insufficient documentation

## 2011-10-05 DIAGNOSIS — O99820 Streptococcus B carrier state complicating pregnancy: Secondary | ICD-10-CM

## 2011-10-05 DIAGNOSIS — R0602 Shortness of breath: Secondary | ICD-10-CM | POA: Insufficient documentation

## 2011-10-05 DIAGNOSIS — R945 Abnormal results of liver function studies: Secondary | ICD-10-CM

## 2011-10-05 LAB — URINALYSIS, ROUTINE W REFLEX MICROSCOPIC
Glucose, UA: NEGATIVE mg/dL
Specific Gravity, Urine: >1.03 — ABNORMAL HIGH (ref 1.005–1.030)
Urobilinogen, UA: 0.2 mg/dL (ref 0.0–1.0)

## 2011-10-05 LAB — COMPREHENSIVE METABOLIC PANEL
ALT: 100 U/L — ABNORMAL HIGH (ref 0–35)
Albumin: 2.5 g/dL — ABNORMAL LOW (ref 3.5–5.2)
Alkaline Phosphatase: 148 U/L — ABNORMAL HIGH (ref 39–117)
Calcium: 8.7 mg/dL (ref 8.4–10.5)
Potassium: 3.3 mEq/L — ABNORMAL LOW (ref 3.5–5.1)
Sodium: 139 mEq/L (ref 135–145)
Total Protein: 6.4 g/dL (ref 6.0–8.3)

## 2011-10-05 LAB — URINE MICROSCOPIC-ADD ON

## 2011-10-05 LAB — CBC WITH DIFFERENTIAL/PLATELET
Basophils Absolute: 0 10*3/uL (ref 0.0–0.1)
Basophils Relative: 0 % (ref 0–1)
Eosinophils Absolute: 0.1 10*3/uL (ref 0.0–0.7)
HCT: 32.1 % — ABNORMAL LOW (ref 36.0–46.0)
Hemoglobin: 10.5 g/dL — ABNORMAL LOW (ref 12.0–15.0)
MCV: 90.4 fL (ref 78.0–100.0)
Neutrophils Relative %: 58 % (ref 43–77)
Platelets: 368 10*3/uL (ref 150–400)
RDW: 13.9 % (ref 11.5–15.5)
WBC: 7.7 10*3/uL (ref 4.0–10.5)

## 2011-10-05 MED ORDER — DOCUSATE SODIUM 100 MG PO CAPS
100.0000 mg | ORAL_CAPSULE | Freq: Two times a day (BID) | ORAL | Status: DC
  Administered 2011-10-06 – 2011-10-07 (×3): 100 mg via ORAL
  Filled 2011-10-05 (×3): qty 1

## 2011-10-05 MED ORDER — FUROSEMIDE 10 MG/ML IJ SOLN
20.0000 mg | Freq: Once | INTRAMUSCULAR | Status: AC
  Administered 2011-10-05: 20 mg via INTRAVENOUS
  Filled 2011-10-05: qty 2

## 2011-10-05 MED ORDER — SODIUM CHLORIDE 0.9 % IV SOLN: 250.0000 mL | INTRAVENOUS | Status: DC | PRN

## 2011-10-05 MED ORDER — ONDANSETRON HCL 4 MG PO TABS: 4.0000 mg | ORAL_TABLET | Freq: Four times a day (QID) | ORAL | Status: DC | PRN

## 2011-10-05 MED ORDER — SIMETHICONE 80 MG PO CHEW: 80.0000 mg | CHEWABLE_TABLET | Freq: Four times a day (QID) | ORAL | Status: DC | PRN

## 2011-10-05 MED ORDER — ZOLPIDEM TARTRATE 5 MG PO TABS: 5.0000 mg | ORAL_TABLET | Freq: Every evening | ORAL | Status: DC | PRN

## 2011-10-05 MED ORDER — IBUPROFEN 600 MG PO TABS
600.0000 mg | ORAL_TABLET | Freq: Four times a day (QID) | ORAL | Status: DC | PRN
  Administered 2011-10-05 – 2011-10-07 (×5): 600 mg via ORAL
  Filled 2011-10-05 (×5): qty 1

## 2011-10-05 MED ORDER — SODIUM CHLORIDE 0.9 % IJ SOLN: 3.0000 mL | INTRAMUSCULAR | Status: DC | PRN

## 2011-10-05 MED ORDER — PRENATAL MULTIVITAMIN CH
1.0000 | ORAL_TABLET | Freq: Every day | ORAL | Status: DC
  Administered 2011-10-06 – 2011-10-07 (×2): 1 via ORAL
  Filled 2011-10-05 (×2): qty 1

## 2011-10-05 MED ORDER — ONDANSETRON HCL 4 MG/2ML IJ SOLN: 4.0000 mg | Freq: Four times a day (QID) | INTRAMUSCULAR | Status: DC | PRN

## 2011-10-05 MED ORDER — MAGNESIUM SULFATE BOLUS VIA INFUSION
4.0000 g | Freq: Once | INTRAVENOUS | Status: AC
  Administered 2011-10-06: 4 g via INTRAVENOUS
  Filled 2011-10-05: qty 500

## 2011-10-05 MED ORDER — MAGNESIUM SULFATE 40 G IN LACTATED RINGERS - SIMPLE
2.0000 g/h | INTRAVENOUS | Status: AC
  Administered 2011-10-06: 2 g/h via INTRAVENOUS
  Filled 2011-10-05: qty 500

## 2011-10-05 MED ORDER — SODIUM CHLORIDE 0.9 % IJ SOLN: 3.0000 mL | Freq: Two times a day (BID) | INTRAMUSCULAR | Status: DC

## 2011-10-05 NOTE — MAU Note (Signed)
Patient states she had a SVD on 9-20. States she started having shortness of breath last night. Home health nurse took her blood pressure and it was elevated and was sent to MAU for evaluation.
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
## 2011-10-05 NOTE — Telephone Encounter (Signed)
TC from [place], [facility]. Pt S/P vag delivery 09/28/11.  Pt's BP 150/90 and 150/100.  Pt c/o shortness of breath since last PM. Has edema of feet which is improving. No headache or vision disturbances. No hx increased BP. Per VL, pt to MAU.  Pt agreeable.
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
## 2011-10-06 DIAGNOSIS — IMO0002 Reserved for concepts with insufficient information to code with codable children: Secondary | ICD-10-CM

## 2011-10-06 LAB — COMPREHENSIVE METABOLIC PANEL
ALT: 72 U/L — ABNORMAL HIGH (ref 0–35)
Calcium: 8.3 mg/dL — ABNORMAL LOW (ref 8.4–10.5)
Creatinine, Ser: 0.69 mg/dL (ref 0.50–1.10)
GFR calc Af Amer: >90 mL/min (ref >90)
Glucose, Bld: 100 mg/dL — ABNORMAL HIGH (ref 70–99)
Sodium: 136 mEq/L (ref 135–145)
Total Protein: 6.4 g/dL (ref 6.0–8.3)

## 2011-10-06 LAB — CBC
Hemoglobin: 11.1 g/dL — ABNORMAL LOW (ref 12.0–15.0)
MCH: 29.8 pg (ref 26.0–34.0)
Platelets: 415 10*3/uL — ABNORMAL HIGH (ref 150–400)
RBC: 3.73 MIL/uL — ABNORMAL LOW (ref 3.87–5.11)
WBC: 7 10*3/uL (ref 4.0–10.5)

## 2011-10-06 LAB — MAGNESIUM: Magnesium: 4.4 mg/dL — ABNORMAL HIGH (ref 1.5–2.5)

## 2011-10-06 MED ORDER — LACTATED RINGERS IV SOLN
INTRAVENOUS | Status: DC
  Administered 2011-10-06 (×3): via INTRAVENOUS

## 2011-10-06 MED ORDER — OXYCODONE-ACETAMINOPHEN 5-325 MG PO TABS
2.0000 | ORAL_TABLET | ORAL | Status: DC | PRN
  Administered 2011-10-06 – 2011-10-07 (×3): 2 via ORAL
  Filled 2011-10-06 (×3): qty 2

## 2011-10-06 MED ORDER — POTASSIUM CHLORIDE CRYS ER 20 MEQ PO TBCR
20.0000 meq | EXTENDED_RELEASE_TABLET | Freq: Two times a day (BID) | ORAL | Status: DC
  Administered 2011-10-06 – 2011-10-07 (×3): 20 meq via ORAL
  Filled 2011-10-06 (×5): qty 1

## 2011-10-06 NOTE — Progress Notes (Signed)
Late entry patient was seen at approximately 2 PM   Subjective: Patient reports no nausea, vomiting, tolerating PO and no problems voiding.  She has a slight headache. She denies visual disturbances. The chest heaviness and shortness of breath of which she complained on admission are improved. She is voiding well.  Objective: I have reviewed patient's vital signs, intake and output, medications and labs. BP 121/82  Pulse 68  Temp 98.2 F (36.8 C) (Oral)  Resp 18  Ht 5\' 3"  (1.6 m)  Wt 172 lb (78.019 kg)  BMI 30.47 kg/m2  SpO2 97%  LMP 12/20/2010  Breastfeeding? Yes   General: alert, cooperative, appears stated age and mild distress from headache Resp: clear to auscultation bilaterally Cardio: S3 present intermittently, otherwise normal rate and rhythm GI: soft, non-tender; bowel sounds normal; no masses,  no organomegaly Extremities: extremities normal, atraumatic, no cyanosis.  Edema trace and much improved.  No tenderness Vaginal Bleeding: minimal and appropriate for 8 days postpartum Results for orders placed during the hospital encounter of 10/05/11 (from the past 24 hour(s))  MRSA PCR SCREENING     Status: Normal   Collection Time   10/06/11  2:00 AM      Component Value Range   MRSA by PCR NEGATIVE  NEGATIVE  COMPREHENSIVE METABOLIC PANEL     Status: Abnormal   Collection Time   10/06/11  3:05 PM      Component Value Range   Sodium 136  135 - 145 mEq/L   Potassium 3.6  3.5 - 5.1 mEq/L   Chloride 103  96 - 112 mEq/L   CO2 24  19 - 32 mEq/L   Glucose, Bld 100 (*) 70 - 99 mg/dL   BUN 12  6 - 23 mg/dL   Creatinine, Ser 4.09  0.50 - 1.10 mg/dL   Calcium 8.3 (*) 8.4 - 10.5 mg/dL   Total Protein 6.4  6.0 - 8.3 g/dL   Albumin 2.4 (*) 3.5 - 5.2 g/dL   AST 35  0 - 37 U/L   ALT 72 (*) 0 - 35 U/L   Alkaline Phosphatase 141 (*) 39 - 117 U/L   Total Bilirubin 0.2 (*) 0.3 - 1.2 mg/dL   GFR calc non Af Amer >90  >90 mL/min   GFR calc Af Amer >90  >90 mL/min  MAGNESIUM      Status: Abnormal   Collection Time   10/06/11  3:05 PM      Component Value Range   Magnesium 4.4 (*) 1.5 - 2.5 mg/dL  CBC     Status: Abnormal   Collection Time   10/06/11  3:05 PM      Component Value Range   WBC 7.0  4.0 - 10.5 K/uL   RBC 3.73 (*) 3.87 - 5.11 MIL/uL   Hemoglobin 11.1 (*) 12.0 - 15.0 g/dL   HCT 81.1 (*) 91.4 - 78.2 %   MCV 90.1  78.0 - 100.0 fL   MCH 29.8  26.0 - 34.0 pg   MCHC 33.0  30.0 - 36.0 g/dL   RDW 95.6  21.3 - 08.6 %   Platelets 415 (*) 150 - 400 K/uL   I/O last 3 completed shifts: In: 3417.1 [P.O.:1320; I.V.:2097.1] Out: 5550 [Urine:5550] Total I/O In: 245 [P.O.:120; I.V.:125] Out: -   Assessment Improved fluid balanxce and blood pressure with diuresis on magnesium Improved liver enzymes Headache without visual symptoms, possibly secondary to magnesium  Plan: Continue Magnesium for a total of 24 hours then d/c  Probable d/c home in am   LOS: 1 day    Deana Krock P 10/06/2011, 8:54 PM

## 2011-10-06 NOTE — Progress Notes (Signed)
Post Partum Day 7 Subjective: Still has some SOB and chest "heaviness"  Ankles much less swollen than on admission  Objective: Blood pressure 140/74, pulse 63, temperature 98.3 F (36.8 C), temperature source Oral, resp. rate 18, last menstrual period 12/20/2010, SpO2 99.00%, currently breastfeeding.  Physical Exam:  General: alert, cooperative, appears stated age and no distress Lungs:  Clear Heart:  Reg rate  Lochia: appropriate Uterine Fundus: firm Incision: na DVT Evaluation: No evidence of DVT seen on physical exam.   Basename 10/05/11 1713  HGB 10.5*  HCT 32.1*    Assessment/Plan: SOB Intermittent BP elevations with elevated liver enzymes and no proteinuria, making pre-eclampsia less likely, but not completely ruled out. Elevated liver enzymes may be secondary to passive liver congestion  PLAN: Admit for  Observation, 24 hrs of magnesium and 24 hr urine collection Manage elevated BPs as needed   LOS: 1 day   HAYGOOD,VANESSA P 10/06/2011, 12:16 AM

## 2011-10-07 DIAGNOSIS — R0602 Shortness of breath: Secondary | ICD-10-CM

## 2011-10-07 DIAGNOSIS — IMO0002 Reserved for concepts with insufficient information to code with codable children: Secondary | ICD-10-CM

## 2011-10-07 DIAGNOSIS — I498 Other specified cardiac arrhythmias: Secondary | ICD-10-CM

## 2011-10-07 LAB — COMPREHENSIVE METABOLIC PANEL
ALT: 53 U/L — ABNORMAL HIGH (ref 0–35)
AST: 22 U/L (ref 0–37)
Alkaline Phosphatase: 128 U/L — ABNORMAL HIGH (ref 39–117)
CO2: 26 mEq/L (ref 19–32)
Chloride: 104 mEq/L (ref 96–112)
GFR calc Af Amer: >90 mL/min (ref >90)
GFR calc non Af Amer: >90 mL/min (ref >90)
Glucose, Bld: 96 mg/dL (ref 70–99)
Potassium: 4.1 mEq/L (ref 3.5–5.1)
Sodium: 137 mEq/L (ref 135–145)
Total Bilirubin: 0.2 mg/dL — ABNORMAL LOW (ref 0.3–1.2)

## 2011-10-07 LAB — PROTEIN, URINE, 24 HOUR
Collection Interval-UPROT: 24 hours
Protein, 24H Urine: 1612 mg/d — ABNORMAL HIGH (ref 50–100)
Protein, Urine: 52 mg/dL

## 2011-10-07 MED ORDER — NIFEDIPINE ER 30 MG PO TB24
30.0000 mg | ORAL_TABLET | Freq: Every day | ORAL | Status: DC
  Administered 2011-10-07: 30 mg via ORAL
  Filled 2011-10-07 (×2): qty 1

## 2011-10-07 MED ORDER — OXYCODONE-ACETAMINOPHEN 5-325 MG PO TABS: 2.0000 | ORAL_TABLET | ORAL | Status: DC | PRN

## 2011-10-07 MED ORDER — NIFEDIPINE ER 30 MG PO TB24: 30.0000 mg | ORAL_TABLET | Freq: Every day | ORAL | Status: DC

## 2011-10-07 NOTE — Progress Notes (Signed)
Left Message with Pearson Forster, [facility], for a request to have BP follow-up with pt on 10/09/2011.

## 2011-10-07 NOTE — Discharge Summary (Signed)
Physician Discharge Summary  Patient ID: [name] [name] MRN: [id number] DOB/AGE: 02-03-1980 31 y.o.  Admit date: 10/05/2011 Discharge date: 10/07/2011  Admission Diagnoses:Shortness of breath                                        Chest heaviness        Elevated liver enzymes  Discharge Diagnoses: Post partum fluid overload        Question post partum pre eclampsia Principal Problem:  *SOB (shortness of breath), elevated LFTs--1 week pp   Discharged Condition: improved, good  Hospital Course: The patient was admitted and begun on magnesium sulfate therapy. She had a simultaneous collection of a 24-hour urine for protein analysis. The result was 1614 mg in 24 hours. It should be noted however that the collection was done without a Foley catheter in place and probably included significant amounts of postpartum lochia. By the time the results of the 24-hour urine were available the patient had been treated with 24 hours of magnesium therapy, and had excellent diurese as, had had resolution of her symptoms of shortness of breath and chest heaviness, liver enzymes had normalized and she was ready for discharge home.  Consults: None  Significant Diagnostic Studies: labs: Comprehensive metabolic panel and CBC and radiology: CXR: Essentially normal.    Treatments: IV hydration and magnesium sulfate and Procardia XL 30 mg was started on 10/07/11  Discharge Exam: Blood pressure 131/84, pulse 80, temperature 98.1 F (36.7 C), temperature source Oral, resp. rate 18, height 5\' 3"  (1.6 m), weight 171 lb 9 oz (77.82 kg), last menstrual period 12/20/2010, SpO2 92.00%, currently breastfeeding. General appearance: alert, cooperative, appears stated age and no distress Resp: clear to auscultation bilaterally Cardio: regular rate and rhythm, S1, S2 normal, no murmur, click, rub or gallop GI: soft, non-tender; bowel sounds normal; no masses,  no organomegaly Extremities: extremities normal, atraumatic, no  cyanosis or edema  Results for orders placed during the hospital encounter of 10/05/11 (from the past 24 hour(s))  COMPREHENSIVE METABOLIC PANEL     Status: Abnormal   Collection Time   10/06/11  3:05 PM      Component Value Range   Sodium 136  135 - 145 mEq/L   Potassium 3.6  3.5 - 5.1 mEq/L   Chloride 103  96 - 112 mEq/L   CO2 24  19 - 32 mEq/L   Glucose, Bld 100 (*) 70 - 99 mg/dL   BUN 12  6 - 23 mg/dL   Creatinine, Ser 8.65  0.50 - 1.10 mg/dL   Calcium 8.3 (*) 8.4 - 10.5 mg/dL   Total Protein 6.4  6.0 - 8.3 g/dL   Albumin 2.4 (*) 3.5 - 5.2 g/dL   AST 35  0 - 37 U/L   ALT 72 (*) 0 - 35 U/L   Alkaline Phosphatase 141 (*) 39 - 117 U/L   Total Bilirubin 0.2 (*) 0.3 - 1.2 mg/dL   GFR calc non Af Amer >90  >90 mL/min   GFR calc Af Amer >90  >90 mL/min  MAGNESIUM     Status: Abnormal   Collection Time   10/06/11  3:05 PM      Component Value Range   Magnesium 4.4 (*) 1.5 - 2.5 mg/dL  CBC     Status: Abnormal   Collection Time   10/06/11  3:05 PM      Component  Value Range   WBC 7.0  4.0 - 10.5 K/uL   RBC 3.73 (*) 3.87 - 5.11 MIL/uL   Hemoglobin 11.1 (*) 12.0 - 15.0 g/dL   HCT 14.7 (*) 82.9 - 56.2 %   MCV 90.1  78.0 - 100.0 fL   MCH 29.8  26.0 - 34.0 pg   MCHC 33.0  30.0 - 36.0 g/dL   RDW 13.0  86.5 - 78.4 %   Platelets 415 (*) 150 - 400 K/uL  COMPREHENSIVE METABOLIC PANEL     Status: Abnormal   Collection Time   10/07/11  5:45 AM      Component Value Range   Sodium 137  135 - 145 mEq/L   Potassium 4.1  3.5 - 5.1 mEq/L   Chloride 104  96 - 112 mEq/L   CO2 26  19 - 32 mEq/L   Glucose, Bld 96  70 - 99 mg/dL   BUN 10  6 - 23 mg/dL   Creatinine, Ser 6.96  0.50 - 1.10 mg/dL   Calcium 8.2 (*) 8.4 - 10.5 mg/dL   Total Protein 5.8 (*) 6.0 - 8.3 g/dL   Albumin 2.3 (*) 3.5 - 5.2 g/dL   AST 22  0 - 37 U/L   ALT 53 (*) 0 - 35 U/L   Alkaline Phosphatase 128 (*) 39 - 117 U/L   Total Bilirubin 0.2 (*) 0.3 - 1.2 mg/dL   GFR calc non Af Amer >90  >90 mL/min   GFR calc Af Amer >90   >90 mL/min    Disposition: 01-Home or Self Care [facility] Nurse will check BP ON 10/09/11     Medication List     As of 10/07/2011  8:54 AM    TAKE these medications         ibuprofen 600 MG tablet   Commonly known as: ADVIL,MOTRIN   Take 1 tablet (600 mg total) by mouth every 6 (six) hours as needed for pain.      NIFEdipine 30 MG 24 hr tablet   Commonly known as: PROCARDIA-XL/ADALAT CC   Take 1 tablet (30 mg total) by mouth daily.      oxyCODONE-acetaminophen 5-325 MG per tablet   Commonly known as: PERCOCET/ROXICET   Take 2 tablets by mouth every 4 (four) hours as needed (may start with one tablet and increase to 2 tabs if pain is unrelieved).      prenatal multivitamin Tabs   Take 1 tablet by mouth daily.           Follow-up Information    Follow up with [name] Bloodworth P, MD. In 6 weeks. (call for appt.Will have [facility] nurse check pt on 10/09/11)    Contact information:   [address] [place] [address] [phone]          Signed: Hal Morales 10/07/2011, 8:54 AM

## 2011-10-08 ENCOUNTER — Telehealth: Payer: Self-pay | Admitting: Obstetrics and Gynecology

## 2011-10-08 NOTE — Progress Notes (Signed)
Post discharge chart review completed.  

## 2011-10-08 NOTE — Telephone Encounter (Signed)
TC from pt. Message stated only one RX was sent to pharmacy instead of 2.  TC to pt. States both Rx were available at pharmacy. To take BP med ASAP.  Call with any concerns. Pt verbalizes comprehension. Congratulations given

## 2011-11-12 ENCOUNTER — Encounter: Payer: Self-pay | Admitting: Obstetrics and Gynecology

## 2011-11-12 ENCOUNTER — Encounter: Payer: Medicaid Other | Admitting: Obstetrics and Gynecology

## 2011-11-12 ENCOUNTER — Ambulatory Visit (INDEPENDENT_AMBULATORY_CARE_PROVIDER_SITE_OTHER): Payer: Medicaid Other | Admitting: Obstetrics and Gynecology

## 2011-11-12 VITALS — BP 110/64 | Temp 98.3°F | Ht 63.0 in | Wt 157.0 lb

## 2011-11-12 DIAGNOSIS — Z3040 Encounter for surveillance of contraceptives, unspecified: Secondary | ICD-10-CM

## 2011-11-12 DIAGNOSIS — Z30017 Encounter for initial prescription of implantable subdermal contraceptive: Secondary | ICD-10-CM

## 2011-11-12 DIAGNOSIS — Z139 Encounter for screening, unspecified: Secondary | ICD-10-CM

## 2011-11-12 MED ORDER — ETONOGESTREL 68 MG ~~LOC~~ IMPL: 68.0000 mg | DRUG_IMPLANT | Freq: Once | SUBCUTANEOUS | Status: DC

## 2011-11-12 MED ORDER — ETONOGESTREL 68 MG ~~LOC~~ IMPL: 1.0000 | DRUG_IMPLANT | Freq: Once | SUBCUTANEOUS | Status: DC

## 2011-11-12 MED ORDER — ETONOGESTREL 68 MG ~~LOC~~ IMPL
68.0000 mg | DRUG_IMPLANT | Freq: Once | SUBCUTANEOUS | Status: AC
  Administered 2011-11-12: 68 mg via SUBCUTANEOUS

## 2011-11-12 NOTE — Progress Notes (Signed)
[id number] INSERTION DATE: 11/12/2011 REMOVAL DATE: 11/12/2014 INSERTION ARM: left arm PALPATED AFTER INSERT: yes IS PT SWITCHING FROM HORMONAL BC: no LOT #:[id number] EXP: 02/2014

## 2011-11-12 NOTE — Progress Notes (Signed)
Subjective:     [name] [name] is a 31 y.o. female who presents for a postpartum visit.  I have fully reviewed the prenatal and intrapartum course. See information above.    Patient is not sexually active.   The following portions of the patient's history were reviewed and updated as appropriate: allergies, current medications, past family history, past medical history, past social history, past surgical history and problem list.  Review of Systems Pertinent items are noted in HPI.   Objective:    BP 110/64  Temp 98.3 F (36.8 C)  Ht 5\' 3"  (1.6 m)  Wt 157 lb (71.215 kg)  BMI 27.81 kg/m2  General:  alert, cooperative and no distress     Lungs: clear to auscultation bilaterally  Heart:  regular rate and rhythm, S1, S2 normal, no murmur  Abdomen: soft, non-tender; bowel sounds normal; no masses,  no organomegaly   Vulva:  normal  Vagina: normal vagina  Cervix:  normal  Corpus: normal size, contour, position, consistency, mobility, non-tender  Adnexa:  normal adnexa        Cesarean section incision well healed: n/a  Procedure to Insert Nexplanon  Procedure reviewed with the patient. Questions were answered.  A permit was signed. Urine pregnancy test is negative.  The left upper arm was prepped with multiple layers of Betadine.  The medial portion was injected with 3 cc of buffered 1% lidocaine. The Nexplanon was inserted according to protocol.  The patient tolerated the procedure well.  There were no complications.  Hemostasis was achieved using pressure.  The upper arm was wrapped with sterile gauze.  The patient will return for followup visit in 6 weeks.  She will call for questions or concerns.  Instructions were given to the patient for caring for her arm.   Assessment:   Normal postpartum exam.  Desires contraception. Pap smear not done at today's visit.   Plan:     1. Contraception: Nexplanon. 2. EPDS: 3. 3. Follow up in: 6 weeks or as needed. 4. Breast Feeding:  Yes. 5. Return to work, exercising, and a normal activities: yes.    Janine Limbo MD 11/12/2011 3:29 PM    Date of delivery: 09/28/2011 Female Name: [name] [name] Vaginal delivery:yes Cesarean section:no Tubal ligation:no GDM:no Breast Feeding:yes Bottle Feeding:no Post-Partum Blues:no Abnormal pap:yes Normal GU function: yes Normal GI function:yes Returning to work:yes EPDS: Score--3

## 2011-12-24 ENCOUNTER — Encounter: Payer: Self-pay | Admitting: Obstetrics and Gynecology

## 2011-12-24 ENCOUNTER — Ambulatory Visit (INDEPENDENT_AMBULATORY_CARE_PROVIDER_SITE_OTHER): Payer: Medicaid Other | Admitting: Obstetrics and Gynecology

## 2011-12-24 VITALS — BP 104/62 | Wt 165.0 lb

## 2011-12-24 DIAGNOSIS — Z309 Encounter for contraceptive management, unspecified: Secondary | ICD-10-CM

## 2011-12-24 DIAGNOSIS — M1990 Unspecified osteoarthritis, unspecified site: Secondary | ICD-10-CM

## 2011-12-24 DIAGNOSIS — M129 Arthropathy, unspecified: Secondary | ICD-10-CM

## 2011-12-24 MED ORDER — TRAMADOL HCL 50 MG PO TABS: 50.0000 mg | ORAL_TABLET | Freq: Four times a day (QID) | ORAL | Status: AC | PRN

## 2011-12-24 NOTE — Progress Notes (Signed)
HISTORY OF PRESENT ILLNESS  Ms. [name] [name] is a 31 y.o. year old female,G2P1011, who presents for a problem visit. The patient had a Nexplanon placed in November 2013.  Subjective:  She complains of pain in her wrist and hands.  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  It has not improved since her pregnancy.  Objective:  BP 104/62  Wt 165 lb (74.844 kg)  Breastfeeding? Yes   General: no distress Extremities: left upper inner arm is well healed.  Tenderness in both wrists.  Motion is good.  Strength is good.  Exam deferred.  Assessment:  Arthritis  Contraceptive management  Plan:  Ibuprofen and Ultram for pain in her hands.  See her primary physician if this does not improve.  Okay to return to normal sexual activity.  Annual exam due February 2014.  Leonard Schwartz M.D.  12/24/2011 2:30 PM

## 2012-02-26 ENCOUNTER — Ambulatory Visit: Payer: Medicaid Other | Admitting: Obstetrics and Gynecology

## 2013-10-03 ENCOUNTER — Encounter (HOSPITAL_COMMUNITY): Payer: Self-pay | Admitting: *Deleted

## 2013-10-03 ENCOUNTER — Inpatient Hospital Stay (HOSPITAL_COMMUNITY)
Admission: AD | Admit: 2013-10-03 | Discharge: 2013-10-03 | Disposition: A | Discharge status: Home / Self Care | Payer: Self-pay | Source: Ambulatory Visit | Attending: Obstetrics & Gynecology | Admitting: Obstetrics & Gynecology

## 2013-10-03 DIAGNOSIS — Z975 Presence of (intrauterine) contraceptive device: Secondary | ICD-10-CM

## 2013-10-03 DIAGNOSIS — N921 Excessive and frequent menstruation with irregular cycle: Secondary | ICD-10-CM

## 2013-10-03 DIAGNOSIS — N949 Unspecified condition associated with female genital organs and menstrual cycle: Secondary | ICD-10-CM | POA: Insufficient documentation

## 2013-10-03 DIAGNOSIS — N938 Other specified abnormal uterine and vaginal bleeding: Secondary | ICD-10-CM | POA: Insufficient documentation

## 2013-10-03 LAB — POCT PREGNANCY, URINE: PREG TEST UR: NEGATIVE

## 2013-10-03 MED ORDER — IBUPROFEN 600 MG PO TABS: 600.0000 mg | ORAL_TABLET | Freq: Once | ORAL | Status: DC
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
## 2013-10-03 NOTE — MAU Note (Addendum)
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     HAS EXPLANON    IN LEFT ARM-  PUT IN  BY [facility].     SAYS AT 38PM-   SHE PASSED  1/2  DOLLAR SIZE CLOT   .  HAS CRAMPS- PAIN - 1 - DID NOT  TAKE ANY MED.      LAST SEX-   2 WEEKS AGO.    IN  ROOM-  SMALL  AMT DARK RED STREAK IN MIDDLE OF PAD,.

## 2013-10-03 NOTE — MAU Provider Note (Signed)
Attestation of Attending Supervision of Advanced Practitioner (CNM/NP): Evaluation and management procedures were performed by the Advanced Practitioner under my supervision and collaboration.  I have reviewed the Advanced Practitioner's note and chart, and I agree with the management and plan.  HARRAWAY-SMITH, Ruby Dilone 11:35 PM

## 2013-10-04 LAB — CBC
HEMATOCRIT: 37.7 % (ref 36.0–46.0)
Hemoglobin: 13.1 g/dL (ref 12.0–15.0)
MCH: 32.9 pg (ref 26.0–34.0)
MCHC: 34.7 g/dL (ref 30.0–36.0)
MCV: 94.7 fL (ref 78.0–100.0)
PLATELETS: 349 10*3/uL (ref 150–400)
RBC: 3.98 MIL/uL (ref 3.87–5.11)
RDW: 12.6 % (ref 11.5–15.5)
WBC: 7 10*3/uL (ref 4.0–10.5)

## 2013-10-23 ENCOUNTER — Ambulatory Visit (INDEPENDENT_AMBULATORY_CARE_PROVIDER_SITE_OTHER): Payer: Medicaid Other | Admitting: Obstetrics & Gynecology

## 2013-10-23 ENCOUNTER — Encounter: Payer: Self-pay | Admitting: Obstetrics & Gynecology

## 2013-10-23 VITALS — BP 123/67 | HR 84 | Temp 98.4°F | Ht 63.0 in | Wt 199.3 lb

## 2013-10-23 DIAGNOSIS — Z3009 Encounter for other general counseling and advice on contraception: Secondary | ICD-10-CM

## 2013-10-23 DIAGNOSIS — Z309 Encounter for contraceptive management, unspecified: Secondary | ICD-10-CM | POA: Diagnosis not present

## 2013-10-23 MED ORDER — NORGESTIM-ETH ESTRAD TRIPHASIC 0.18/0.215/0.25 MG-35 MCG PO TABS: 1.0000 | ORAL_TABLET | Freq: Every day | ORAL | Status: DC
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
## 2013-10-23 NOTE — Progress Notes (Signed)
Patient here for Nexplanon removal. Inserted 2 years ago but recently experienced very heavy bleeding and wants it removed. Requests to try OCPs.

## 2013-11-09 ENCOUNTER — Encounter: Payer: Self-pay | Admitting: Obstetrics & Gynecology

## 2013-12-04 ENCOUNTER — Inpatient Hospital Stay (HOSPITAL_COMMUNITY)
Admission: AD | Admit: 2013-12-04 | Discharge: 2013-12-04 | Disposition: A | Discharge status: Home / Self Care | Payer: Medicaid Other | Source: Ambulatory Visit | Attending: Obstetrics & Gynecology | Admitting: Obstetrics & Gynecology

## 2013-12-04 ENCOUNTER — Encounter (HOSPITAL_COMMUNITY): Payer: Self-pay | Admitting: *Deleted

## 2013-12-04 DIAGNOSIS — Z3201 Encounter for pregnancy test, result positive: Secondary | ICD-10-CM | POA: Insufficient documentation

## 2013-12-04 LAB — URINALYSIS, ROUTINE W REFLEX MICROSCOPIC
Bilirubin Urine: NEGATIVE
Glucose, UA: NEGATIVE mg/dL
Ketones, ur: NEGATIVE mg/dL
Leukocytes, UA: NEGATIVE
Nitrite: NEGATIVE
PROTEIN: NEGATIVE mg/dL
Specific Gravity, Urine: >1.03 — ABNORMAL HIGH (ref 1.005–1.030)
UROBILINOGEN UA: 0.2 mg/dL (ref 0.0–1.0)
pH: 5.5 (ref 5.0–8.0)

## 2013-12-04 LAB — URINE MICROSCOPIC-ADD ON

## 2013-12-04 LAB — POCT PREGNANCY, URINE: Preg Test, Ur: POSITIVE — AB

## 2013-12-04 NOTE — MAU Note (Signed)
Nexplanon removed 10/16, started on bill control pills, had HPT yesterday.  Denies pain or bleeding.

## 2013-12-04 NOTE — Discharge Instructions (Signed)
Prenatal Care  WHAT IS PRENATAL CARE?  Prenatal care means health care during your pregnancy, before your baby is born. It is very important to take care of yourself and your baby during your pregnancy by:   Getting early prenatal care. If you know you are pregnant, or think you might be pregnant, call your health care provider as soon as possible. Schedule a visit for a prenatal exam.  Getting regular prenatal care. Follow your health care provider's schedule for blood and other necessary tests. Do not miss appointments.  Doing everything you can to keep yourself and your baby healthy during your pregnancy.  Getting complete care. Prenatal care should include evaluation of the medical, dietary, educational, psychological, and social needs of you and your significant other. The medical and genetic history of your family and the family of your baby's father should be discussed with your health care provider.  Discussing with your health care provider:  Prescription, over-the-counter, and herbal medicines that you take.  Any history of substance abuse, alcohol use, smoking, and illegal drug use.  Any history of domestic abuse and violence.  Immunizations you have received.  Your nutrition and diet.  The amount of exercise you do.  Any environmental and occupational hazards to which you are exposed.  History of sexually transmitted infections for both you and your partner.  Previous pregnancies you have had. WHY IS PRENATAL CARE SO IMPORTANT?  By regularly seeing your health care provider, you help ensure that problems can be identified early so that they can be treated as soon as possible. Other problems might be prevented. Many studies have shown that early and regular prenatal care is important for the health of mothers and their babies.  HOW CAN I TAKE CARE OF MYSELF WHILE I AM PREGNANT?  Here are ways to take care of yourself and your baby:   Start or continue taking your  multivitamin with 400 micrograms (mcg) of folic acid every day.  Get early and regular prenatal care. It is very important to see a health care provider during your pregnancy. Your health care provider will check at each visit to make sure that you and your baby are healthy. If there are any problems, action can be taken right away to help you and your baby.  Eat a healthy diet that includes:  Fruits.  Vegetables.  Foods low in saturated fat.  Whole grains.  Calcium-rich foods, such as milk, yogurt, and hard cheeses.  Drink 6-8 glasses of liquids a day.  Unless your health care provider tells you not to, try to be physically active for 30 minutes, most days of the week. If you are pressed for time, you can get your activity in through 10-minute segments, three times a day.  Do not smoke, drink alcohol, or use drugs. These can cause long-term damage to your baby. Talk with your health care provider about steps to take to stop smoking. Talk with a member of your faith community, a counselor, a trusted friend, or your health care provider if you are concerned about your alcohol or drug use.  Ask your health care provider before taking any medicine, even over-the-counter medicines. Some medicines are not safe to take during pregnancy.  Get plenty of rest and sleep.  Avoid hot tubs and saunas during pregnancy.  Do not have X-rays taken unless absolutely necessary and with the recommendation of your health care provider. A lead shield can be placed on your abdomen to protect your baby when   X-rays are taken in other parts of your body.  Do not empty the cat litter when you are pregnant. It may contain a parasite that causes an infection called toxoplasmosis, which can cause birth defects. Also, use gloves when working in garden areas used by cats.  Do not eat uncooked or undercooked meats or fish.  Do not eat soft, mold-ripened cheeses (Brie, Camembert, and chevre) or soft, blue-veined  cheese (Danish blue and Roquefort).  Stay away from toxic chemicals like:  Insecticides.  Solvents (some cleaners or paint thinners).  Lead.  Mercury.  Sexual intercourse may continue until the end of the pregnancy, unless you have a medical problem or there is a problem with the pregnancy and your health care provider tells you not to.  Do not wear high-heel shoes, especially during the second half of the pregnancy. You can lose your balance and fall.  Do not take long trips, unless absolutely necessary. Be sure to see your health care provider before going on the trip.  Do not sit in one position for more than 2 hours when on a trip.  Take a copy of your medical records when going on a trip. Know where a hospital is located in the city you are visiting, in case of an emergency.  Most dangerous household products will have pregnancy warnings on their labels. Ask your health care provider about products if you are unsure.  Limit or eliminate your caffeine intake from coffee, tea, sodas, medicines, and chocolate.  Many women continue working through pregnancy. Staying active might help you stay healthier. If you have a question about the safety or the hours you work at your particular job, talk with your health care provider.  Get informed:  Read books.  Watch videos.  Go to childbirth classes for you and your significant other.  Talk with experienced moms.  Ask your health care provider about childbirth education classes for you and your partner. Classes can help you and your partner prepare for the birth of your baby.  Ask about a baby doctor (pediatrician) and methods and pain medicine for labor, delivery, and possible cesarean delivery. HOW OFTEN SHOULD I SEE MY HEALTH CARE PROVIDER DURING PREGNANCY?  Your health care provider will give you a schedule for your prenatal visits. You will have visits more often as you get closer to the end of your pregnancy. An average  pregnancy lasts about 40 weeks.  A typical schedule includes visiting your health care provider:   About once each month during your first 6 months of pregnancy.  Every 2 weeks during the next 2 months.  Weekly in the last month, until the delivery date. Your health care provider will probably want to see you more often if:  You are older than 35 years.  Your pregnancy is high risk because you have certain health problems or problems with the pregnancy, such as:  Diabetes.  High blood pressure.  The baby is not growing on schedule, according to the dates of the pregnancy. Your health care provider will do special tests to make sure you and your baby are not having any serious problems. WHAT HAPPENS DURING PRENATAL VISITS?   At your first prenatal visit, your health care provider will do a physical exam and talk to you about your health history and the health history of your partner and your family. Your health care provider will be able to tell you what date to expect your baby to be born on.  Your   first physical exam will include checks of your blood pressure, measurements of your height and weight, and an exam of your pelvic organs. Your health care provider will do a Pap test if you have not had one recently and will do cultures of your cervix to make sure there is no infection.  At each prenatal visit, there will be tests of your blood, urine, blood pressure, weight, and the progress of the baby will be checked.  At your later prenatal visits, your health care provider will check how you are doing and how your baby is developing. You may have a number of tests done as your pregnancy progresses.  Ultrasound exams are often used to check on your baby's growth and health.  You may have more urine and blood tests, as well as special tests, if needed. These may include amniocentesis to examine fluid in the pregnancy sac, stress tests to check how the baby responds to contractions, or a  biophysical profile to measure your baby's well-being. Your health care provider will explain the tests and why they are necessary.  You should be tested for high blood sugar (gestational diabetes) between the 24th and 28th weeks of your pregnancy.  You should discuss with your health care provider your plans to breastfeed or bottle-feed your baby.  Each visit is also a chance for you to learn about staying healthy during pregnancy and to ask questions. Document Released: 12/28/2002 Document Revised: 12/30/2012 Document Reviewed: 03/11/2013 Exit[employer] Patient Information 2015 [employer]. This information is not intended to replace advice given to you by your health care provider. Make sure you discuss any questions you have with your health care provider.  

## 2013-12-04 NOTE — MAU Note (Signed)
Pt in Room 9.  Denies any complaint of pain.  Chest pain comes and goes and is gone now.  No vaginal bleeding or any abnormal discharge, itching or burning.

## 2013-12-04 NOTE — MAU Note (Signed)
Pt states she has mild chest pain, states she had this the entire time during her last pregnancy.
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
## 2013-12-04 NOTE — MAU Note (Signed)
Urine in lab 

## 2013-12-07 ENCOUNTER — Inpatient Hospital Stay (HOSPITAL_COMMUNITY)
Admission: AD | Admit: 2013-12-07 | Discharge: 2013-12-07 | Disposition: A | Discharge status: Home / Self Care | Payer: Medicaid Other | Source: Ambulatory Visit | Attending: Obstetrics & Gynecology | Admitting: Obstetrics & Gynecology

## 2013-12-07 ENCOUNTER — Encounter (HOSPITAL_COMMUNITY): Payer: Self-pay | Admitting: *Deleted

## 2013-12-07 ENCOUNTER — Other Ambulatory Visit (HOSPITAL_COMMUNITY): Payer: Self-pay | Admitting: Obstetrics and Gynecology

## 2013-12-07 ENCOUNTER — Inpatient Hospital Stay (HOSPITAL_COMMUNITY): Payer: Medicaid Other

## 2013-12-07 DIAGNOSIS — K219 Gastro-esophageal reflux disease without esophagitis: Secondary | ICD-10-CM | POA: Diagnosis not present

## 2013-12-07 DIAGNOSIS — O26899 Other specified pregnancy related conditions, unspecified trimester: Secondary | ICD-10-CM

## 2013-12-07 DIAGNOSIS — Z3A01 Less than 8 weeks gestation of pregnancy: Secondary | ICD-10-CM | POA: Diagnosis not present

## 2013-12-07 DIAGNOSIS — O210 Mild hyperemesis gravidarum: Secondary | ICD-10-CM | POA: Diagnosis present

## 2013-12-07 DIAGNOSIS — R109 Unspecified abdominal pain: Secondary | ICD-10-CM

## 2013-12-07 DIAGNOSIS — O9989 Other specified diseases and conditions complicating pregnancy, childbirth and the puerperium: Secondary | ICD-10-CM

## 2013-12-07 DIAGNOSIS — O99611 Diseases of the digestive system complicating pregnancy, first trimester: Secondary | ICD-10-CM | POA: Insufficient documentation

## 2013-12-07 LAB — URINALYSIS, ROUTINE W REFLEX MICROSCOPIC
GLUCOSE, UA: NEGATIVE mg/dL
KETONES UR: 15 mg/dL — AB
Nitrite: NEGATIVE
PH: 6 (ref 5.0–8.0)
Protein, ur: NEGATIVE mg/dL
SPECIFIC GRAVITY, URINE: 1.025 (ref 1.005–1.030)
Urobilinogen, UA: 1 mg/dL (ref 0.0–1.0)

## 2013-12-07 LAB — CBC
HCT: 39.1 % (ref 36.0–46.0)
Hemoglobin: 14 g/dL (ref 12.0–15.0)
MCH: 33.5 pg (ref 26.0–34.0)
MCHC: 35.8 g/dL (ref 30.0–36.0)
MCV: 93.5 fL (ref 78.0–100.0)
PLATELETS: 346 10*3/uL (ref 150–400)
RBC: 4.18 MIL/uL (ref 3.87–5.11)
RDW: 12.6 % (ref 11.5–15.5)
WBC: 6 10*3/uL (ref 4.0–10.5)

## 2013-12-07 LAB — WET PREP, GENITAL
CLUE CELLS WET PREP: NONE SEEN
Trich, Wet Prep: NONE SEEN
Yeast Wet Prep HPF POC: NONE SEEN

## 2013-12-07 LAB — URINE MICROSCOPIC-ADD ON

## 2013-12-07 LAB — HIV ANTIBODY (ROUTINE TESTING W REFLEX): HIV 1&2 Ab, 4th Generation: NONREACTIVE

## 2013-12-07 LAB — HCG, QUANTITATIVE, PREGNANCY: hCG, Beta Chain, Quant, S: 574 m[IU]/mL — ABNORMAL HIGH (ref <5)

## 2013-12-07 MED ORDER — ONE-A-DAY WOMENS PRENATAL 1 28-0.8-235 MG PO CAPS: 1.0000 | ORAL_CAPSULE | Freq: Every day | ORAL | Status: DC

## 2013-12-07 MED ORDER — GI COCKTAIL ~~LOC~~
30.0000 mL | Freq: Once | ORAL | Status: AC
  Administered 2013-12-07: 30 mL via ORAL
  Filled 2013-12-07: qty 30

## 2013-12-07 MED ORDER — RANITIDINE HCL 150 MG PO TABS: 150.0000 mg | ORAL_TABLET | Freq: Two times a day (BID) | ORAL | Status: DC
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
## 2013-12-07 NOTE — Discharge Instructions (Signed)

## 2013-12-07 NOTE — MAU Note (Signed)
Patient states she has been having vomiting, abdominal pain and shortness of breath. Denies bleeding or discharge.

## 2013-12-08 LAB — GC/CHLAMYDIA PROBE AMP
CT Probe RNA: NEGATIVE
GC PROBE AMP APTIMA: NEGATIVE

## 2013-12-09 ENCOUNTER — Other Ambulatory Visit: Payer: Medicaid Other

## 2013-12-09 ENCOUNTER — Telehealth: Payer: Self-pay | Admitting: General Practice

## 2013-12-09 DIAGNOSIS — O3680X0 Pregnancy with inconclusive fetal viability, not applicable or unspecified: Secondary | ICD-10-CM

## 2013-12-09 LAB — OB RESULTS CONSOLE RPR: RPR: NONREACTIVE

## 2013-12-09 LAB — OB RESULTS CONSOLE GC/CHLAMYDIA
Chlamydia: NEGATIVE
Gonorrhea: NEGATIVE

## 2013-12-09 LAB — OB RESULTS CONSOLE HEPATITIS B SURFACE ANTIGEN: Hepatitis B Surface Ag: NEGATIVE

## 2013-12-09 LAB — OB RESULTS CONSOLE ABO/RH: RH Type: POSITIVE

## 2013-12-09 LAB — OB RESULTS CONSOLE RUBELLA ANTIBODY, IGM: Rubella: IMMUNE

## 2013-12-09 LAB — OB RESULTS CONSOLE HIV ANTIBODY (ROUTINE TESTING): HIV: NONREACTIVE

## 2013-12-09 LAB — HCG, QUANTITATIVE, PREGNANCY: HCG, BETA CHAIN, QUANT, S: 1491 m[IU]/mL

## 2013-12-09 LAB — OB RESULTS CONSOLE ANTIBODY SCREEN: ANTIBODY SCREEN: NEGATIVE

## 2013-12-09 NOTE — Telephone Encounter (Signed)
Spoke to [employer] about bhcg results. Recommended no further bhcg follow up but ultrasound in 2 weeks. Called patient and informed her of results and recommendations. Patient states she can come in the morning. Made appt for 12/14 @ 9:15. Patient verbalized understanding and had no questions

## 2013-12-21 ENCOUNTER — Ambulatory Visit (HOSPITAL_COMMUNITY)
Admission: RE | Admit: 2013-12-21 | Discharge: 2013-12-21 | Disposition: A | Discharge status: Home / Self Care | Payer: Medicaid Other | Source: Ambulatory Visit | Attending: Advanced Practice Midwife | Admitting: Advanced Practice Midwife

## 2013-12-21 DIAGNOSIS — R109 Unspecified abdominal pain: Secondary | ICD-10-CM | POA: Insufficient documentation

## 2013-12-21 DIAGNOSIS — O9989 Other specified diseases and conditions complicating pregnancy, childbirth and the puerperium: Secondary | ICD-10-CM | POA: Insufficient documentation

## 2013-12-21 DIAGNOSIS — Z3A01 Less than 8 weeks gestation of pregnancy: Secondary | ICD-10-CM | POA: Diagnosis not present

## 2013-12-21 DIAGNOSIS — O3680X0 Pregnancy with inconclusive fetal viability, not applicable or unspecified: Secondary | ICD-10-CM

## 2013-12-24 ENCOUNTER — Encounter: Payer: Self-pay | Admitting: *Deleted

## 2014-01-08 NOTE — L&D Delivery Note (Signed)
Final Labor Progress Note Called to room by primary care nurse at 0405.  Pt c/o increase [name] and pressure.   SP late decel x1 at [phone].  VE 3-4/90/-1.  Anesthesia called to the room for a epidural bolus.  After she was comfortable, AROM at 0415 light meconimum, VE 4-5/90-1.  [name] was relieved temporarily, at 0430 VE 5-6/C/0.  Repeat variable decels noted, FSE placed.   At 0445 pt reports an increased in rectal pressure.  VE 8-9/C/+1.  FHR remained reassuring with occasional variable decelerations.  Vaginal Delivery Note The pt utilized an epidural as [name] management.   Artificial rupture of membranes today, at 0415, light meconimum.  GBS was positive, Vancomycin x 1 doses were given.  Cervical dilation was complete at  0453.  NICHD Category 2.    Pushing with guidance began at  0453.   After 4 minutes of pushing the head, shoulders and the body of a viable female infant "[name] [name]" delivered spontaneously with maternal effort in the ROA position at 0457.   With vigorous tone and slightly delayed cry, the infant was placed on moms abd. After stimulation a loud cry was heard.  After the umbilical cord was clamped it was cut by the FOB, then cord blood was obtained for evaluation.  Spontaneous delivery of a intact placenta with a 3 vessel cord via [place] at  [phone].   Episiotomy: None   The vulva, perineum, vaginal vault, rectum and cervix were inspected no repairs needed.  Postpartum pitocin as ordered.  Fundus firm, lochia minimum, bleeding under control.   EBL 200, Pt hemodynamically stable.   Sponge, laps and needle count correct and verified with the primary care nurse.  Attending MD available at all times.    Routine postpartum orders   Mother desires BTL for contraception, Epidural catheter was left in place.    Placenta to pathology: NO     Cord Gases sent to lab: NO Cord blood sent to lab: YES   APGARS:  9 at 1 minute and 9 at 5 minutes Weight:. pending     Both mom and baby  were left in stable condition      Cheryll Keisler, CNM, MSN 08/10/2014. 5:28 AM

## 2014-02-16 ENCOUNTER — Inpatient Hospital Stay (HOSPITAL_COMMUNITY)
Admission: AD | Admit: 2014-02-16 | Discharge: 2014-02-17 | Disposition: A | Discharge status: Home / Self Care | Payer: Medicaid Other | Source: Ambulatory Visit | Attending: Obstetrics and Gynecology | Admitting: Obstetrics and Gynecology

## 2014-02-16 ENCOUNTER — Encounter (HOSPITAL_COMMUNITY): Payer: Self-pay | Admitting: *Deleted

## 2014-02-16 DIAGNOSIS — O2342 Unspecified infection of urinary tract in pregnancy, second trimester: Secondary | ICD-10-CM | POA: Diagnosis not present

## 2014-02-16 DIAGNOSIS — O210 Mild hyperemesis gravidarum: Secondary | ICD-10-CM | POA: Diagnosis not present

## 2014-02-16 DIAGNOSIS — Z880 Allergy status to penicillin: Secondary | ICD-10-CM | POA: Insufficient documentation

## 2014-02-16 DIAGNOSIS — Z3A14 14 weeks gestation of pregnancy: Secondary | ICD-10-CM | POA: Insufficient documentation

## 2014-02-16 LAB — CBC
HCT: 37.2 % (ref 36.0–46.0)
Hemoglobin: 13.4 g/dL (ref 12.0–15.0)
MCH: 33.3 pg (ref 26.0–34.0)
MCHC: 36 g/dL (ref 30.0–36.0)
MCV: 92.5 fL (ref 78.0–100.0)
PLATELETS: 304 10*3/uL (ref 150–400)
RBC: 4.02 MIL/uL (ref 3.87–5.11)
RDW: 13.2 % (ref 11.5–15.5)
WBC: 8.7 10*3/uL (ref 4.0–10.5)

## 2014-02-16 LAB — COMPREHENSIVE METABOLIC PANEL
ALBUMIN: 3.4 g/dL — AB (ref 3.5–5.2)
ALK PHOS: 57 U/L (ref 39–117)
ALT: 14 U/L (ref 0–35)
AST: 15 U/L (ref 0–37)
Anion gap: 5 (ref 5–15)
BUN: 6 mg/dL (ref 6–23)
CO2: 26 mmol/L (ref 19–32)
Calcium: 8.8 mg/dL (ref 8.4–10.5)
Chloride: 105 mmol/L (ref 96–112)
Creatinine, Ser: 0.58 mg/dL (ref 0.50–1.10)
GFR calc Af Amer: >90 mL/min (ref >90)
GFR calc non Af Amer: >90 mL/min (ref >90)
GLUCOSE: 104 mg/dL — AB (ref 70–99)
Potassium: 3.6 mmol/L (ref 3.5–5.1)
Sodium: 136 mmol/L (ref 135–145)
TOTAL PROTEIN: 6.7 g/dL (ref 6.0–8.3)
Total Bilirubin: 0.4 mg/dL (ref 0.3–1.2)

## 2014-02-16 MED ORDER — LACTATED RINGERS IV SOLN
INTRAVENOUS | Status: DC
  Administered 2014-02-16: 23:00:00 via INTRAVENOUS

## 2014-02-16 MED ORDER — ONDANSETRON HCL 4 MG/2ML IJ SOLN
4.0000 mg | Freq: Once | INTRAMUSCULAR | Status: AC
  Administered 2014-02-16: 4 mg via INTRAVENOUS
  Filled 2014-02-16: qty 2

## 2014-02-16 MED ORDER — ACETAMINOPHEN 325 MG PO TABS
650.0000 mg | ORAL_TABLET | Freq: Once | ORAL | Status: AC
Start: 2014-02-16 — End: 2014-02-16
  Administered 2014-02-16: 650 mg via ORAL
  Filled 2014-02-16: qty 2

## 2014-02-16 MED ORDER — LACTATED RINGERS IV BOLUS (SEPSIS)
500.0000 mL | Freq: Once | INTRAVENOUS | Status: AC
  Administered 2014-02-16: 23:00:00 via INTRAVENOUS

## 2014-02-16 NOTE — MAU Note (Signed)
Vomiting x 2 weeks; states unable to keep food down. Abdominal cramping. Currently taking meds for UTI. Has headache and feels weak.  Denies vaginal bleeding/discharge/LOF.

## 2014-02-17 ENCOUNTER — Encounter (HOSPITAL_COMMUNITY): Payer: Self-pay | Admitting: Obstetrics and Gynecology

## 2014-02-17 DIAGNOSIS — Z880 Allergy status to penicillin: Secondary | ICD-10-CM

## 2014-02-17 NOTE — MAU Note (Signed)
K WIlliams CNM in room going over discharge instructions.
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
## 2014-05-14 ENCOUNTER — Encounter (HOSPITAL_COMMUNITY): Payer: Self-pay

## 2014-05-14 ENCOUNTER — Other Ambulatory Visit: Payer: Self-pay | Admitting: Obstetrics and Gynecology

## 2014-05-14 ENCOUNTER — Inpatient Hospital Stay (HOSPITAL_COMMUNITY)
Admission: AD | Admit: 2014-05-14 | Discharge: 2014-05-14 | Disposition: A | Discharge status: Home / Self Care | Payer: Medicaid Other | Source: Ambulatory Visit | Attending: Obstetrics and Gynecology | Admitting: Obstetrics and Gynecology

## 2014-05-14 DIAGNOSIS — Z880 Allergy status to penicillin: Secondary | ICD-10-CM | POA: Diagnosis not present

## 2014-05-14 DIAGNOSIS — Z3A27 27 weeks gestation of pregnancy: Secondary | ICD-10-CM | POA: Insufficient documentation

## 2014-05-14 DIAGNOSIS — O212 Late vomiting of pregnancy: Secondary | ICD-10-CM | POA: Diagnosis not present

## 2014-05-14 DIAGNOSIS — R112 Nausea with vomiting, unspecified: Secondary | ICD-10-CM

## 2014-05-14 LAB — PREALBUMIN: PREALBUMIN: 19.5 mg/dL (ref 18–38)

## 2014-05-14 LAB — URINALYSIS, ROUTINE W REFLEX MICROSCOPIC
Bilirubin Urine: NEGATIVE
GLUCOSE, UA: NEGATIVE mg/dL
KETONES UR: NEGATIVE mg/dL
LEUKOCYTES UA: NEGATIVE
NITRITE: NEGATIVE
PH: 5.5 (ref 5.0–8.0)
PROTEIN: 30 mg/dL — AB
Specific Gravity, Urine: >1.03 — ABNORMAL HIGH (ref 1.005–1.030)
Urobilinogen, UA: 0.2 mg/dL (ref 0.0–1.0)

## 2014-05-14 LAB — URINE MICROSCOPIC-ADD ON

## 2014-05-14 MED ORDER — PANTOPRAZOLE SODIUM 40 MG IV SOLR
40.0000 mg | Freq: Once | INTRAVENOUS | Status: AC
  Administered 2014-05-14: 40 mg via INTRAVENOUS
  Filled 2014-05-14: qty 40

## 2014-05-14 MED ORDER — METOCLOPRAMIDE HCL 10 MG PO TABS: 10.0000 mg | ORAL_TABLET | Freq: Three times a day (TID) | ORAL | Status: DC | PRN

## 2014-05-14 MED ORDER — ONDANSETRON HCL 4 MG/2ML IJ SOLN
4.0000 mg | Freq: Once | INTRAMUSCULAR | Status: AC
  Administered 2014-05-14: 4 mg via INTRAVENOUS
  Filled 2014-05-14: qty 2

## 2014-05-14 MED ORDER — LACTATED RINGERS IV SOLN: INTRAVENOUS | Status: DC

## 2014-05-14 MED ORDER — LACTATED RINGERS IV BOLUS (SEPSIS)
500.0000 mL | Freq: Once | INTRAVENOUS | Status: AC
  Administered 2014-05-14: 500 mL via INTRAVENOUS

## 2014-05-14 MED ORDER — ONDANSETRON 8 MG PO TBDP: 8.0000 mg | ORAL_TABLET | Freq: Three times a day (TID) | ORAL | Status: DC | PRN

## 2014-05-14 MED ORDER — ONDANSETRON HCL 4 MG PO TABS: 4.0000 mg | ORAL_TABLET | Freq: Three times a day (TID) | ORAL | Status: DC | PRN

## 2014-05-14 NOTE — Discharge Instructions (Signed)
Hyperemesis Gravidarum °Hyperemesis gravidarum is a severe form of nausea and vomiting that happens during pregnancy. Hyperemesis is worse than morning sickness. It may cause you to have nausea or vomiting all day for many days. It may keep you from eating and drinking enough food and liquids. Hyperemesis usually occurs during the first half (the first 20 weeks) of pregnancy. It often goes away once a woman is in her second half of pregnancy. However, sometimes hyperemesis continues through an entire pregnancy.  °CAUSES  °The cause of this condition is not completely known but is thought to be related to changes in the body's hormones when pregnant. It could be from the high level of the pregnancy hormone or an increase in estrogen in the body.  °SIGNS AND SYMPTOMS  °· Severe nausea and vomiting. °· Nausea that does not go away. °· Vomiting that does not allow you to keep any food down. °· Weight loss and body fluid loss (dehydration). °· Having no desire to eat or not liking food you have previously enjoyed. °DIAGNOSIS  °Your health care provider will do a physical exam and ask you about your symptoms. He or she may also order blood tests and urine tests to make sure something else is not causing the problem.  °TREATMENT  °You may only need medicine to control the problem. If medicines do not control the nausea and vomiting, you will be treated in the hospital to prevent dehydration, increased acid in the blood (acidosis), weight loss, and changes in the electrolytes in your body that may harm the unborn baby (fetus). You may need IV fluids.  °HOME CARE INSTRUCTIONS  °· Only take over-the-counter or prescription medicines as directed by your health care provider. °· Try eating a couple of dry crackers or toast in the morning before getting out of bed. °· Avoid foods and smells that upset your stomach. °· Avoid fatty and spicy foods. °· Eat 5-6 small meals a day. °· Do not drink when eating meals. Drink between  meals. °· For snacks, eat high-protein foods, such as cheese. °· Eat or suck on things that have ginger in them. Ginger helps nausea. °· Avoid food preparation. The smell of food can spoil your appetite. °· Avoid iron pills and iron in your multivitamins until after 3-4 months of being pregnant. However, consult with your health care provider before stopping any prescribed iron pills. °SEEK MEDICAL CARE IF:  °· Your abdominal pain increases. °· You have a severe headache. °· You have vision problems. °· You are losing weight. °SEEK IMMEDIATE MEDICAL CARE IF:  °· You are unable to keep fluids down. °· You vomit blood. °· You have constant nausea and vomiting. °· You have excessive weakness. °· You have extreme thirst. °· You have dizziness or fainting. °· You have a fever or persistent symptoms for more than 2-3 days. °· You have a fever and your symptoms suddenly get worse. °MAKE SURE YOU:  °· Understand these instructions. °· Will watch your condition. °· Will get help right away if you are not doing well or get worse. °Document Released: 12/25/2004 Document Revised: 10/15/2012 Document Reviewed: 08/06/2012 °[employer]® Patient Information ©2015 [employer]. This information is not intended to replace advice given to you by your health care provider. Make sure you discuss any questions you have with your health care provider. ° °

## 2014-05-14 NOTE — MAU Note (Addendum)
N/v for a wk. Have been sick the entire pregnancy. No diarrhea. Some dizziness and cramping
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
## 2014-05-14 NOTE — MAU Note (Signed)
Notified Gavin Pound CNM patient in room 2 in MAU has received bolus of LR, Protonix and Zofran as ordered.

## 2014-06-28 ENCOUNTER — Inpatient Hospital Stay (HOSPITAL_COMMUNITY): Payer: Medicaid Other

## 2014-06-28 ENCOUNTER — Observation Stay (HOSPITAL_COMMUNITY)
Admission: AD | Admit: 2014-06-28 | Discharge: 2014-06-29 | Disposition: A | Discharge status: Home / Self Care | Payer: Medicaid Other | Source: Ambulatory Visit | Attending: Obstetrics and Gynecology | Admitting: Obstetrics and Gynecology

## 2014-06-28 ENCOUNTER — Encounter (HOSPITAL_COMMUNITY): Payer: Self-pay | Admitting: *Deleted

## 2014-06-28 DIAGNOSIS — O210 Mild hyperemesis gravidarum: Secondary | ICD-10-CM | POA: Diagnosis present

## 2014-06-28 DIAGNOSIS — Z3A34 34 weeks gestation of pregnancy: Secondary | ICD-10-CM | POA: Insufficient documentation

## 2014-06-28 DIAGNOSIS — O212 Late vomiting of pregnancy: Secondary | ICD-10-CM | POA: Diagnosis not present

## 2014-06-28 DIAGNOSIS — O368390 Maternal care for abnormalities of the fetal heart rate or rhythm, unspecified trimester, not applicable or unspecified: Secondary | ICD-10-CM

## 2014-06-28 LAB — CBC
HCT: 31.1 % — ABNORMAL LOW (ref 36.0–46.0)
Hemoglobin: 10.6 g/dL — ABNORMAL LOW (ref 12.0–15.0)
MCH: 31.1 pg (ref 26.0–34.0)
MCHC: 34.1 g/dL (ref 30.0–36.0)
MCV: 91.2 fL (ref 78.0–100.0)
PLATELETS: 239 10*3/uL (ref 150–400)
RBC: 3.41 MIL/uL — ABNORMAL LOW (ref 3.87–5.11)
RDW: 13.4 % (ref 11.5–15.5)
WBC: 5.6 10*3/uL (ref 4.0–10.5)

## 2014-06-28 LAB — COMPREHENSIVE METABOLIC PANEL
ALT: 11 U/L — ABNORMAL LOW (ref 14–54)
ANION GAP: 8 (ref 5–15)
AST: 16 U/L (ref 15–41)
Albumin: 2.7 g/dL — ABNORMAL LOW (ref 3.5–5.0)
Alkaline Phosphatase: 66 U/L (ref 38–126)
BUN: <5 mg/dL — ABNORMAL LOW (ref 6–20)
CALCIUM: 8.6 mg/dL — AB (ref 8.9–10.3)
CO2: 22 mmol/L (ref 22–32)
CREATININE: 0.55 mg/dL (ref 0.44–1.00)
Chloride: 108 mmol/L (ref 101–111)
GFR calc Af Amer: >60 mL/min (ref >60)
GFR calc non Af Amer: >60 mL/min (ref >60)
GLUCOSE: 82 mg/dL (ref 65–99)
Potassium: 4.1 mmol/L (ref 3.5–5.1)
Sodium: 138 mmol/L (ref 135–145)
Total Bilirubin: 1 mg/dL (ref 0.3–1.2)
Total Protein: 6.8 g/dL (ref 6.5–8.1)

## 2014-06-28 LAB — LIPASE, BLOOD: Lipase: 23 U/L (ref 22–51)

## 2014-06-28 LAB — AMYLASE: AMYLASE: 87 U/L (ref 28–100)

## 2014-06-28 MED ORDER — LACTATED RINGERS IV BOLUS (SEPSIS)
1000.0000 mL | Freq: Once | INTRAVENOUS | Status: AC
  Administered 2014-06-28: 1000 mL via INTRAVENOUS

## 2014-06-28 MED ORDER — ACETAMINOPHEN 325 MG PO TABS: 650.0000 mg | ORAL_TABLET | ORAL | Status: DC | PRN

## 2014-06-28 MED ORDER — LACTATED RINGERS IV SOLN
Freq: Once | INTRAVENOUS | Status: AC
  Administered 2014-06-28: 18:00:00 via INTRAVENOUS

## 2014-06-28 MED ORDER — CALCIUM CARBONATE ANTACID 500 MG PO CHEW: 2.0000 | CHEWABLE_TABLET | ORAL | Status: DC | PRN

## 2014-06-28 MED ORDER — ZOLPIDEM TARTRATE 5 MG PO TABS: 5.0000 mg | ORAL_TABLET | Freq: Every evening | ORAL | Status: DC | PRN

## 2014-06-28 MED ORDER — PROMETHAZINE HCL 25 MG/ML IJ SOLN
25.0000 mg | Freq: Once | INTRAVENOUS | Status: AC
  Administered 2014-06-28: 25 mg via INTRAVENOUS
  Filled 2014-06-28: qty 1

## 2014-06-28 MED ORDER — PRENATAL MULTIVITAMIN CH: 1.0000 | ORAL_TABLET | Freq: Every day | ORAL | Status: DC

## 2014-06-28 MED ORDER — SODIUM CHLORIDE 0.9 % IV SOLN: INTRAVENOUS | Status: DC

## 2014-06-28 MED ORDER — ONDANSETRON HCL 4 MG/2ML IJ SOLN
4.0000 mg | Freq: Once | INTRAMUSCULAR | Status: AC
  Administered 2014-06-28: 4 mg via INTRAVENOUS
  Filled 2014-06-28: qty 2

## 2014-06-28 MED ORDER — DOCUSATE SODIUM 100 MG PO CAPS: 100.0000 mg | ORAL_CAPSULE | Freq: Every day | ORAL | Status: DC

## 2014-06-28 MED ORDER — SODIUM CHLORIDE 0.9 % IV SOLN
8.0000 mg | Freq: Four times a day (QID) | INTRAVENOUS | Status: DC | PRN
  Administered 2014-06-28 – 2014-06-29 (×2): 8 mg via INTRAVENOUS
  Filled 2014-06-28 (×5): qty 4

## 2014-06-28 NOTE — Progress Notes (Signed)
On Antepartum. States not sure if N/V persists as she is very hungry. Requests meal. Of note, unable to keep down crackers and juice in MAU.   P: Will attempt clear liquid diet.   Farrel Gordon, CNM 06/28/14, 8:19 PM

## 2014-06-28 NOTE — MAU Note (Signed)
Urine in lab 

## 2014-06-28 NOTE — Progress Notes (Signed)
TC from Antenatal RN to review [place]. Strip reviewed and variable decels noted, however moderate variability throughout. Rare ctxs. IVF bolus, 02 and position change ordered. Will also proceed w/ limited bedside u/s, to include BPP and AFI. Will consult Dr. Alesia Richards.   Farrel Gordon, CNM 06/28/14, 10:10 PM
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
## 2014-06-29 MED ORDER — LACTATED RINGERS IV SOLN
INTRAVENOUS | Status: DC
  Administered 2014-06-29 (×2): via INTRAVENOUS

## 2014-06-29 NOTE — Progress Notes (Addendum)
S: No further vomiting, but +ptyalism. Able to tolerate po. Denies dizziness or lightheadedness.  Today's Vitals   06/29/14 0100 06/29/14 0107 06/29/14 0108 06/29/14 0109  BP: 79/44 89/57  88/56  Pulse: 103 114 99 114  Temp:      TempSrc:      Resp:  20    Height:      Weight:      SpO2:   99%   PainSc:       Limited u/s = FHR 150 bpm, Cardiac activity observed. BPP 8/8 in 14 min. Cephalic presentation. Posterior placenta. Cord insertion not well visualized. AFI 16.89 cm, largest pocket 5.22 cm -- 61st %tile.  P: Continuous monitoring. Suspect variables may be related to pt's low BPs. Continue IVFs Zofran prn -- last received at 23:11 PM Update Dr. Benjiman Core, CNM 06/29/14, 1:38 AM

## 2014-06-29 NOTE — Progress Notes (Signed)
Hospital day # 1 pregnancy at [redacted]w[redacted]d--hyperemersis.  She feel better today.  She was able to keep down the liquid diet she was given last night.  She is requesting a regular diet prior to DC to make sure she can tolerate it.  S:  Perception of contractions: none      Vaginal bleeding: none now       Vaginal discharge:  no significant change  O: BP 88/52 mmHg  Pulse 84  Temp(Src) 98.3 F (36.8 C) (Axillary)  Resp 16  Ht 5\' 3"  (1.6 m)  Wt 184 lb (83.462 kg)  BMI 32.60 kg/m2  SpO2 99%  LMP 11/02/2013 (Approximate)      Fetal tracings:145      Contractions:  none       Uterus gravid and non-tender      Extremities: no significant edema and no signs of DVT          Labs:   Results for orders placed or performed during the hospital encounter of 06/28/14 (from the past 24 hour(s))  CBC     Status: Abnormal   Collection Time: 06/28/14  2:52 PM  Result Value Ref Range   WBC 5.6 4.0 - 10.5 K/uL   RBC 3.41 (L) 3.87 - 5.11 MIL/uL   Hemoglobin 10.6 (L) 12.0 - 15.0 g/dL   HCT 31.1 (L) 36.0 - 46.0 %   MCV 91.2 78.0 - 100.0 fL   MCH 31.1 26.0 - 34.0 pg   MCHC 34.1 30.0 - 36.0 g/dL   RDW 13.4 11.5 - 15.5 %   Platelets 239 150 - 400 K/uL  Comprehensive metabolic panel     Status: Abnormal   Collection Time: 06/28/14  2:52 PM  Result Value Ref Range   Sodium 138 135 - 145 mmol/L   Potassium 4.1 3.5 - 5.1 mmol/L   Chloride 108 101 - 111 mmol/L   CO2 22 22 - 32 mmol/L   Glucose, Bld 82 65 - 99 mg/dL   BUN <5 (L) 6 - 20 mg/dL   Creatinine, Ser 0.55 0.44 - 1.00 mg/dL   Calcium 8.6 (L) 8.9 - 10.3 mg/dL   Total Protein 6.8 6.5 - 8.1 g/dL   Albumin 2.7 (L) 3.5 - 5.0 g/dL   AST 16 15 - 41 U/L   ALT 11 (L) 14 - 54 U/L   Alkaline Phosphatase 66 38 - 126 U/L   Total Bilirubin 1.0 0.3 - 1.2 mg/dL   GFR calc non Af Amer >60 >60 mL/min   GFR calc Af Amer >60 >60 mL/min   Anion gap 8 5 - 15  Amylase     Status: None   Collection Time: 06/28/14  2:52 PM  Result Value Ref Range   Amylase 87 28 -  100 U/L  Lipase, blood     Status: None   Collection Time: 06/28/14  2:52 PM  Result Value Ref Range   Lipase 23 22 - 51 U/L         Meds:  Current facility-administered medications:  .  acetaminophen (TYLENOL) tablet 650 mg, 650 mg, Oral, Q4H PRN, Rheannon Cerney, CNM .  calcium carbonate (TUMS - dosed in mg elemental calcium) chewable tablet 400 mg of elemental calcium, 2 tablet, Oral, Q4H PRN, Nelton Amsden, CNM .  docusate sodium (COLACE) capsule 100 mg, 100 mg, Oral, Daily, Thao Bauza, CNM .  lactated ringers infusion, , Intravenous, Continuous, Farrel Gordon, CNM, Last Rate: 125 mL/hr at 06/29/14 0500 .  ondansetron (ZOFRAN)  8 mg in sodium chloride 0.9 % 50 mL IVPB, 8 mg, Intravenous, Q6H PRN, Chloeann Alfred, CNM, 8 mg at 06/28/14 2311 .  prenatal multivitamin tablet 1 tablet, 1 tablet, Oral, Q1200, Romesha Scherer, CNM .  zolpidem (AMBIEN) tablet 5 mg, 5 mg, Oral, QHS PRN, Oberia Beaudoin, CNM  A: [redacted]w[redacted]d with hyperemesis      stable     Fetal tracings: Category 1     Contractions: none     Uterus non-tender      Extremities: DTR 1+, no clonus, no edema   P: Continue current plan of care      Upcoming tests/treatments:        MDs will follow Regular diet per pt request  Ryna Beckstrom, CNM, MSN 06/29/2014. 7:46 AM

## 2014-06-29 NOTE — Discharge Summary (Signed)
Physician Discharge Summary  Patient ID: [name] [name] MRN: [id number] DOB/AGE: [birth year] 34 y.o.  Admit date: 06/28/2014 Discharge date: 06/29/2014  Admission Diagnoses: hyperemesis  Discharge Diagnoses:  Active Problems:   Hyperemesis affecting pregnancy, antepartum   Discharged Condition: stable  Hospital Course: antiemetic, IV fluids  Consults: None  Significant Diagnostic Studies: n/a  Treatments: IV hydration  Discharge Exam: Blood pressure 102/54, pulse 97, temperature 98.2 F (36.8 C), temperature source Oral, resp. rate 18, height 5\' 3"  (1.6 m), weight 184 lb (83.462 kg), last menstrual period 11/02/2013, SpO2 99 %. General appearance: alert and cooperative  Disposition: 01-Home or Self Care  Discharge Instructions    Discharge activity:  No Restrictions    Complete by:  As directed      Discharge diet:  No restrictions    Complete by:  As directed      Discharge instructions    Complete by:  As directed   Per [facility] handout     Fetal Kick Count:  Lie on our left side for one hour after a meal, and count the number of times your baby kicks.  If it is less than 5 times, get up, move around and drink some juice.  Repeat the test 30 minutes later.  If it is still less than 5 kicks in an hour, notify your doctor.    Complete by:  As directed      No sexual activity restrictions    Complete by:  As directed      Notify physician for a general feeling that "something is not right"    Complete by:  As directed      Notify physician for increase or change in vaginal discharge    Complete by:  As directed      Notify physician for intestinal cramps, with or without diarrhea, sometimes described as "gas pain"    Complete by:  As directed      Notify physician for leaking of fluid    Complete by:  As directed      Notify physician for low, dull backache, unrelieved by heat or Tylenol    Complete by:  As directed      Notify physician for menstrual like cramps     Complete by:  As directed      Notify physician for pelvic pressure    Complete by:  As directed      Notify physician for uterine contractions.  These may be painless and feel like the uterus is tightening or the baby is  "balling up"    Complete by:  As directed      Notify physician for vaginal bleeding    Complete by:  As directed      PRETERM LABOR:  Includes any of the follwing symptoms that occur between 20 - [redacted] weeks gestation.  If these symptoms are not stopped, preterm labor can result in preterm delivery, placing your baby at risk    Complete by:  As directed             Medication List    TAKE these medications        metoCLOPramide 10 MG tablet  Commonly known as:  REGLAN  Take 1 tablet (10 mg total) by mouth 3 (three) times daily with meals as needed for nausea or vomiting.     nystatin cream  Commonly known as:  MYCOSTATIN  Apply 1 application topically 2 (two) times daily as needed (To prevent yeast infection.).  ondansetron 8 MG disintegrating tablet  Commonly known as:  ZOFRAN ODT  Take 1 tablet (8 mg total) by mouth every 8 (eight) hours as needed for nausea or vomiting.     ONE-A-DAY WOMENS PRENATAL 1 28-0.8-235 MG Caps  Take 1 capsule by mouth daily.           Follow-up Information    Follow up with [facility].   Specialty:  Obstetrics and Gynecology   Why:  Next OB Visit   Contact information:   [address] [address] [phone]      Signed: Sallee Provencal, CNM, MSN 06/29/2014, 12:21 PM

## 2014-06-29 NOTE — Plan of Care (Signed)
Problem: Consults Goal: Birthing Suites Patient Information Press F2 to bring up selections list   Pt < [redacted] weeks EGA     

## 2014-07-22 LAB — OB RESULTS CONSOLE GBS: STREP GROUP B AG: POSITIVE

## 2014-08-09 ENCOUNTER — Inpatient Hospital Stay (HOSPITAL_COMMUNITY): Payer: Medicaid Other | Admitting: Anesthesiology

## 2014-08-09 ENCOUNTER — Inpatient Hospital Stay (HOSPITAL_COMMUNITY)
Admission: AD | Admit: 2014-08-09 | Discharge: 2014-08-12 | DRG: 767 | Disposition: A | Discharge status: Home / Self Care | Payer: Medicaid Other | Source: Ambulatory Visit | Attending: Obstetrics and Gynecology | Admitting: Obstetrics and Gynecology

## 2014-08-09 ENCOUNTER — Encounter (HOSPITAL_COMMUNITY): Payer: Self-pay | Admitting: *Deleted

## 2014-08-09 DIAGNOSIS — A630 Anogenital (venereal) warts: Secondary | ICD-10-CM | POA: Diagnosis present

## 2014-08-09 DIAGNOSIS — W3400XS Accidental discharge from unspecified firearms or gun, sequela: Secondary | ICD-10-CM

## 2014-08-09 DIAGNOSIS — Z1624 Resistance to multiple antibiotics: Secondary | ICD-10-CM | POA: Diagnosis present

## 2014-08-09 DIAGNOSIS — D6489 Other specified anemias: Secondary | ICD-10-CM | POA: Diagnosis present

## 2014-08-09 DIAGNOSIS — Z8041 Family history of malignant neoplasm of ovary: Secondary | ICD-10-CM | POA: Diagnosis not present

## 2014-08-09 DIAGNOSIS — Z79899 Other long term (current) drug therapy: Secondary | ICD-10-CM | POA: Diagnosis not present

## 2014-08-09 DIAGNOSIS — Z3A40 40 weeks gestation of pregnancy: Secondary | ICD-10-CM | POA: Diagnosis not present

## 2014-08-09 DIAGNOSIS — W3400XA Accidental discharge from unspecified firearms or gun, initial encounter: Secondary | ICD-10-CM

## 2014-08-09 DIAGNOSIS — O0903 Supervision of pregnancy with history of infertility, third trimester: Secondary | ICD-10-CM | POA: Diagnosis not present

## 2014-08-09 DIAGNOSIS — R141 Gas pain: Secondary | ICD-10-CM | POA: Diagnosis not present

## 2014-08-09 DIAGNOSIS — A6000 Herpesviral infection of urogenital system, unspecified: Secondary | ICD-10-CM | POA: Diagnosis present

## 2014-08-09 DIAGNOSIS — B951 Streptococcus, group B, as the cause of diseases classified elsewhere: Secondary | ICD-10-CM | POA: Diagnosis present

## 2014-08-09 DIAGNOSIS — O9902 Anemia complicating childbirth: Secondary | ICD-10-CM | POA: Diagnosis present

## 2014-08-09 DIAGNOSIS — O9852 Other viral diseases complicating childbirth: Secondary | ICD-10-CM | POA: Diagnosis present

## 2014-08-09 DIAGNOSIS — O09529 Supervision of elderly multigravida, unspecified trimester: Secondary | ICD-10-CM

## 2014-08-09 DIAGNOSIS — O99824 Streptococcus B carrier state complicating childbirth: Secondary | ICD-10-CM | POA: Diagnosis present

## 2014-08-09 DIAGNOSIS — O480 Post-term pregnancy: Principal | ICD-10-CM | POA: Diagnosis present

## 2014-08-09 DIAGNOSIS — Z880 Allergy status to penicillin: Secondary | ICD-10-CM

## 2014-08-09 DIAGNOSIS — Z302 Encounter for sterilization: Secondary | ICD-10-CM

## 2014-08-09 DIAGNOSIS — O09299 Supervision of pregnancy with other poor reproductive or obstetric history, unspecified trimester: Secondary | ICD-10-CM

## 2014-08-09 HISTORY — DX: Herpesviral infection, unspecified: B00.9

## 2014-08-09 LAB — TYPE AND SCREEN
ABO/RH(D): O POS
Antibody Screen: NEGATIVE

## 2014-08-09 LAB — CBC
HCT: 30.5 % — ABNORMAL LOW (ref 36.0–46.0)
HEMOGLOBIN: 9.9 g/dL — AB (ref 12.0–15.0)
MCH: 28.8 pg (ref 26.0–34.0)
MCHC: 32.5 g/dL (ref 30.0–36.0)
MCV: 88.7 fL (ref 78.0–100.0)
PLATELETS: 252 10*3/uL (ref 150–400)
RBC: 3.44 MIL/uL — AB (ref 3.87–5.11)
RDW: 13.9 % (ref 11.5–15.5)
WBC: 7.2 10*3/uL (ref 4.0–10.5)

## 2014-08-09 MED ORDER — TERBUTALINE SULFATE 1 MG/ML IJ SOLN: 0.2500 mg | Freq: Once | INTRAMUSCULAR | Status: AC | PRN

## 2014-08-09 MED ORDER — FLEET ENEMA 7-19 GM/118ML RE ENEM: 1.0000 | ENEMA | RECTAL | Status: DC | PRN

## 2014-08-09 MED ORDER — OXYCODONE-ACETAMINOPHEN 5-325 MG PO TABS
2.0000 | ORAL_TABLET | ORAL | Status: DC | PRN
Start: 2014-08-09 — End: 2014-08-10

## 2014-08-09 MED ORDER — EPHEDRINE 5 MG/ML INJ
10.0000 mg | INTRAVENOUS | Status: DC | PRN
  Filled 2014-08-09: qty 2

## 2014-08-09 MED ORDER — ONDANSETRON HCL 4 MG/2ML IJ SOLN: 4.0000 mg | Freq: Four times a day (QID) | INTRAMUSCULAR | Status: DC | PRN

## 2014-08-09 MED ORDER — LACTATED RINGERS IV SOLN
INTRAVENOUS | Status: DC
  Administered 2014-08-09: 19:00:00 via INTRAVENOUS

## 2014-08-09 MED ORDER — CITRIC ACID-SODIUM CITRATE 334-500 MG/5ML PO SOLN: 30.0000 mL | ORAL | Status: DC | PRN

## 2014-08-09 MED ORDER — OXYCODONE-ACETAMINOPHEN 5-325 MG PO TABS: 1.0000 | ORAL_TABLET | ORAL | Status: DC | PRN

## 2014-08-09 MED ORDER — OXYTOCIN BOLUS FROM INFUSION
500.0000 mL | INTRAVENOUS | Status: DC
  Administered 2014-08-10: 500 mL via INTRAVENOUS

## 2014-08-09 MED ORDER — LIDOCAINE HCL (PF) 1 % IJ SOLN
INTRAMUSCULAR | Status: DC | PRN
  Administered 2014-08-09 (×2): 4 mL

## 2014-08-09 MED ORDER — PHENYLEPHRINE 40 MCG/ML (10ML) SYRINGE FOR IV PUSH (FOR BLOOD PRESSURE SUPPORT)
80.0000 ug | PREFILLED_SYRINGE | INTRAVENOUS | Status: DC | PRN
  Filled 2014-08-09: qty 2
  Filled 2014-08-09: qty 20

## 2014-08-09 MED ORDER — FENTANYL CITRATE (PF) 100 MCG/2ML IJ SOLN: 100.0000 ug | INTRAMUSCULAR | Status: DC | PRN

## 2014-08-09 MED ORDER — FENTANYL 2.5 MCG/ML BUPIVACAINE 1/10 % EPIDURAL INFUSION (WH - ANES)
14.0000 mL/h | INTRAMUSCULAR | Status: DC | PRN
  Administered 2014-08-09 – 2014-08-10 (×3): 14 mL/h via EPIDURAL
  Filled 2014-08-09 (×2): qty 125

## 2014-08-09 MED ORDER — ACETAMINOPHEN 325 MG PO TABS: 650.0000 mg | ORAL_TABLET | ORAL | Status: DC | PRN

## 2014-08-09 MED ORDER — DIPHENHYDRAMINE HCL 50 MG/ML IJ SOLN: 12.5000 mg | INTRAMUSCULAR | Status: DC | PRN

## 2014-08-09 MED ORDER — OXYTOCIN 40 UNITS IN LACTATED RINGERS INFUSION - SIMPLE MED
1.0000 m[IU]/min | INTRAVENOUS | Status: DC
  Administered 2014-08-09: 2 m[IU]/min via INTRAVENOUS

## 2014-08-09 MED ORDER — OXYTOCIN 40 UNITS IN LACTATED RINGERS INFUSION - SIMPLE MED
62.5000 mL/h | INTRAVENOUS | Status: DC
  Filled 2014-08-09: qty 1000

## 2014-08-09 MED ORDER — VANCOMYCIN HCL IN DEXTROSE 1-5 GM/200ML-% IV SOLN
1000.0000 mg | Freq: Two times a day (BID) | INTRAVENOUS | Status: DC
  Administered 2014-08-09: 1000 mg via INTRAVENOUS
  Filled 2014-08-09 (×2): qty 200

## 2014-08-09 MED ORDER — LACTATED RINGERS IV SOLN
500.0000 mL | INTRAVENOUS | Status: DC | PRN
  Administered 2014-08-09 (×2): 500 mL via INTRAVENOUS

## 2014-08-09 MED ORDER — LIDOCAINE HCL (PF) 1 % IJ SOLN
30.0000 mL | INTRAMUSCULAR | Status: DC | PRN
  Filled 2014-08-09: qty 30
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
## 2014-08-09 NOTE — Anesthesia Preprocedure Evaluation (Signed)
Anesthesia Evaluation  Patient identified by MRN, date of birth, ID band Patient awake    Reviewed: Allergy & Precautions, NPO status , Patient's Chart, lab work & pertinent test results  History of Anesthesia Complications Negative for: history of anesthetic complications  Airway Mallampati: II  TM Distance: >3 FB Neck ROM: Full    Dental no notable dental hx. (+) Dental Advisory Given   Pulmonary asthma ,  breath sounds clear to auscultation  Pulmonary exam normal       Cardiovascular negative cardio ROS Normal cardiovascular examRhythm:Regular Rate:Normal     Neuro/Psych PSYCHIATRIC DISORDERS Anxiety Depression negative neurological ROS     GI/Hepatic negative GI ROS, Neg liver ROS,   Endo/Other  negative endocrine ROS  Renal/GU negative Renal ROS  negative genitourinary   Musculoskeletal negative musculoskeletal ROS (+)   Abdominal   Peds negative pediatric ROS (+)  Hematology negative hematology ROS (+)   Anesthesia Other Findings   Reproductive/Obstetrics (+) Pregnancy                             Anesthesia Physical Anesthesia Plan  ASA: II  Anesthesia Plan: Epidural   Post-op Pain Management:    Induction:   Airway Management Planned:   Additional Equipment:   Intra-op Plan:   Post-operative Plan:   Informed Consent: I have reviewed the patients History and Physical, chart, labs and discussed the procedure including the risks, benefits and alternatives for the proposed anesthesia with the patient or authorized representative who has indicated his/her understanding and acceptance.     Plan Discussed with: Anesthesiologist  Anesthesia Plan Comments:         Anesthesia Quick Evaluation

## 2014-08-09 NOTE — Anesthesia Procedure Notes (Signed)
Epidural Patient location during procedure: OB Start time: 08/09/2014 10:49 PM  Staffing Anesthesiologist: Lauretta Grill Performed by: anesthesiologist   Preanesthetic Checklist Completed: patient identified, site marked, surgical consent, pre-op evaluation, timeout performed, IV checked, risks and benefits discussed and monitors and equipment checked  Epidural Patient position: sitting Prep: site prepped and draped and DuraPrep Patient monitoring: continuous pulse ox and blood pressure Approach: midline Location: L3-L4 Injection technique: LOR saline  Needle:  Needle type: Tuohy  Needle gauge: 17 G Needle length: 9 cm and 9 Needle insertion depth: 5 cm cm Catheter type: closed end flexible Catheter size: 19 Gauge Catheter at skin depth: 10 cm Test dose: negative  Assessment Events: blood not aspirated, injection not painful, no injection resistance, negative IV test and no paresthesia  Additional Notes Patient identified. Risks/Benefits/Options discussed with patient including but not limited to bleeding, infection, nerve damage, paralysis, failed block, incomplete pain control, headache, blood pressure changes, nausea, vomiting, reactions to medication both or allergic, itching and postpartum back pain. Confirmed with bedside nurse the patient's most recent platelet count. Confirmed with patient that they are not currently taking any anticoagulation, have any bleeding history or any family history of bleeding disorders. Patient expressed understanding and wished to proceed. All questions were answered. Sterile technique was used throughout the entire procedure. Please see nursing notes for vital signs. Test dose was given through epidural catheter and negative prior to continuing to dose epidural or start infusion. Warning signs of high block given to the patient including shortness of breath, tingling/numbness in hands, complete motor block, or any concerning symptoms with instructions  to call for help. Patient was given instructions on fall risk and not to get out of bed. All questions and concerns addressed with instructions to call with any issues or inadequate analgesia.

## 2014-08-09 NOTE — Progress Notes (Addendum)
Labor Progress  Subjective: Slightly feeling ctx, but reports they have spaced out.  Requesting to eat a little dinner then walk, bc "the ctx get stronger when I walk", then start pitocin.  She reports d/t fear she prolonged her labor before but this time she is ready for pitocin but she wants to wait until her partner arrive.  Objective: BP 108/59 mmHg  Pulse 73  Temp(Src) 98 F (36.7 C) (Oral)  Resp 18  Ht 5\' 3"  (1.6 m)  Wt 192 lb (87.091 kg)  BMI 34.02 kg/m2  LMP 11/02/2013 (Approximate)     FHT: 135, moderate variability, + accel, no decel CTX:  irregular, every 3-8 minutes Uterus gravid, soft non tender SVE:  Dilation: 3 Effacement (%): 50 Station: -1, -2 Exam by:: Collan Schoenfeld, CNM   Assessment:  IUP at 40.0 weeks NICHD: Category 1 with occasional prolong sleep cycles Membranes:  intact Labor progress: IOL GBS: postiive Vanc abx complete   Plan: Continue labor plan intermittent monitoring OK to Ambulate after 20 minutes category 1 strip Light dinner ok Will reassess with cervical exam at 2200 or earlier if necessary Pitocin to start at [place], CNM, MSN 08/09/2014. 8:43 PM

## 2014-08-09 NOTE — Progress Notes (Signed)
Addendum 1105 Epidural placement complete
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
## 2014-08-09 NOTE — Progress Notes (Signed)
Addendum 2230  Vertex confirmed by [place] Pitocin to start 2x2 Epidural per pt request

## 2014-08-09 NOTE — MAU Note (Signed)
Having contractions all day. Was 3cm in office. Advised to walk for awhile then come to MAU for re-check. No bleeding or leaking. + FM

## 2014-08-10 ENCOUNTER — Encounter (HOSPITAL_COMMUNITY): Payer: Self-pay | Admitting: *Deleted

## 2014-08-10 ENCOUNTER — Encounter (HOSPITAL_COMMUNITY)
Admission: AD | Disposition: A | Discharge status: Home / Self Care | Payer: Self-pay | Source: Ambulatory Visit | Attending: Obstetrics and Gynecology

## 2014-08-10 ENCOUNTER — Inpatient Hospital Stay (HOSPITAL_COMMUNITY): Payer: Medicaid Other | Admitting: Anesthesiology

## 2014-08-10 HISTORY — PX: VULVAR LESION REMOVAL: SHX5391

## 2014-08-10 HISTORY — PX: BILATERAL SALPINGECTOMY: SHX5743

## 2014-08-10 LAB — HIV ANTIBODY (ROUTINE TESTING W REFLEX): HIV Screen 4th Generation wRfx: NONREACTIVE

## 2014-08-10 LAB — RPR: RPR: NONREACTIVE

## 2014-08-10 SURGERY — VULVAR LESION
Anesthesia: Epidural | Site: Vulva | Laterality: Left

## 2014-08-10 MED ORDER — SODIUM BICARBONATE 8.4 % IV SOLN
INTRAVENOUS | Status: AC
  Filled 2014-08-10: qty 50

## 2014-08-10 MED ORDER — KETOROLAC TROMETHAMINE 30 MG/ML IJ SOLN
30.0000 mg | Freq: Once | INTRAMUSCULAR | Status: AC
  Administered 2014-08-10: 30 mg via INTRAVENOUS

## 2014-08-10 MED ORDER — LACTATED RINGERS IV SOLN
INTRAVENOUS | Status: DC
  Administered 2014-08-10: 20 mL/h via INTRAVENOUS

## 2014-08-10 MED ORDER — PROPOFOL 10 MG/ML IV BOLUS
INTRAVENOUS | Status: AC
  Filled 2014-08-10: qty 20

## 2014-08-10 MED ORDER — TETANUS-DIPHTH-ACELL PERTUSSIS 5-2.5-18.5 LF-MCG/0.5 IM SUSP: 0.5000 mL | Freq: Once | INTRAMUSCULAR | Status: DC

## 2014-08-10 MED ORDER — ZOLPIDEM TARTRATE 5 MG PO TABS: 5.0000 mg | ORAL_TABLET | Freq: Every evening | ORAL | Status: DC | PRN

## 2014-08-10 MED ORDER — LIDOCAINE-EPINEPHRINE 1 %-1:100000 IJ SOLN
INTRAMUSCULAR | Status: AC
  Filled 2014-08-10: qty 1

## 2014-08-10 MED ORDER — MEPERIDINE HCL 25 MG/ML IJ SOLN: 6.2500 mg | INTRAMUSCULAR | Status: DC | PRN

## 2014-08-10 MED ORDER — SENNOSIDES-DOCUSATE SODIUM 8.6-50 MG PO TABS
2.0000 | ORAL_TABLET | ORAL | Status: DC
  Administered 2014-08-11 – 2014-08-12 (×2): 2 via ORAL
  Filled 2014-08-10 (×2): qty 2

## 2014-08-10 MED ORDER — MIDAZOLAM HCL 5 MG/ML IJ SOLN
INTRAMUSCULAR | Status: DC | PRN
  Administered 2014-08-10 (×2): 1 mg via INTRAVENOUS

## 2014-08-10 MED ORDER — PHENYLEPHRINE HCL 10 MG/ML IJ SOLN
INTRAMUSCULAR | Status: DC | PRN
  Administered 2014-08-10: 80 ug via INTRAVENOUS

## 2014-08-10 MED ORDER — KETOROLAC TROMETHAMINE 30 MG/ML IJ SOLN: 30.0000 mg | Freq: Once | INTRAMUSCULAR | Status: AC

## 2014-08-10 MED ORDER — SODIUM BICARBONATE 8.4 % IV SOLN
INTRAVENOUS | Status: DC | PRN
  Administered 2014-08-10: 5 mL via EPIDURAL
  Administered 2014-08-10: 2 mL via EPIDURAL
  Administered 2014-08-10: 3 mL via EPIDURAL
  Administered 2014-08-10: 5 mL via EPIDURAL

## 2014-08-10 MED ORDER — KETOROLAC TROMETHAMINE 30 MG/ML IJ SOLN
INTRAMUSCULAR | Status: AC
  Filled 2014-08-10: qty 1

## 2014-08-10 MED ORDER — METOCLOPRAMIDE HCL 5 MG/ML IJ SOLN: 10.0000 mg | Freq: Once | INTRAMUSCULAR | Status: AC | PRN

## 2014-08-10 MED ORDER — MIDAZOLAM HCL 2 MG/2ML IJ SOLN
INTRAMUSCULAR | Status: AC
  Filled 2014-08-10: qty 4

## 2014-08-10 MED ORDER — PRENATAL MULTIVITAMIN CH
1.0000 | ORAL_TABLET | Freq: Every day | ORAL | Status: DC
  Administered 2014-08-11 – 2014-08-12 (×2): 1 via ORAL
  Filled 2014-08-10 (×2): qty 1

## 2014-08-10 MED ORDER — LIDOCAINE HCL (CARDIAC) 20 MG/ML IV SOLN
INTRAVENOUS | Status: DC | PRN
  Administered 2014-08-10: 50 mg via INTRAVENOUS

## 2014-08-10 MED ORDER — FENTANYL CITRATE (PF) 100 MCG/2ML IJ SOLN
INTRAMUSCULAR | Status: AC
  Administered 2014-08-10: 50 ug via INTRAVENOUS
  Filled 2014-08-10: qty 2

## 2014-08-10 MED ORDER — DIBUCAINE 1 % RE OINT
1.0000 "application " | TOPICAL_OINTMENT | RECTAL | Status: DC | PRN
  Administered 2014-08-12: 1 via RECTAL

## 2014-08-10 MED ORDER — SILVER NITRATE-POT NITRATE 75-25 % EX MISC
CUTANEOUS | Status: DC | PRN
  Administered 2014-08-10: 2

## 2014-08-10 MED ORDER — LIDOCAINE HCL (CARDIAC) 20 MG/ML IV SOLN
INTRAVENOUS | Status: AC
  Filled 2014-08-10: qty 5

## 2014-08-10 MED ORDER — LANOLIN HYDROUS EX OINT
TOPICAL_OINTMENT | CUTANEOUS | Status: DC | PRN
Start: 2014-08-10 — End: 2014-08-12

## 2014-08-10 MED ORDER — PHENYLEPHRINE 40 MCG/ML (10ML) SYRINGE FOR IV PUSH (FOR BLOOD PRESSURE SUPPORT)
PREFILLED_SYRINGE | INTRAVENOUS | Status: AC
  Filled 2014-08-10: qty 10

## 2014-08-10 MED ORDER — LIDOCAINE-EPINEPHRINE 1 %-1:100000 IJ SOLN
INTRAMUSCULAR | Status: DC | PRN
  Administered 2014-08-10: 10 mL

## 2014-08-10 MED ORDER — OXYCODONE-ACETAMINOPHEN 5-325 MG PO TABS
1.0000 | ORAL_TABLET | ORAL | Status: DC | PRN
  Administered 2014-08-10: 1 via ORAL
  Filled 2014-08-10: qty 1

## 2014-08-10 MED ORDER — SIMETHICONE 80 MG PO CHEW
80.0000 mg | CHEWABLE_TABLET | ORAL | Status: DC | PRN
  Administered 2014-08-11: 80 mg via ORAL
  Filled 2014-08-10: qty 1

## 2014-08-10 MED ORDER — DIPHENHYDRAMINE HCL 25 MG PO CAPS: 25.0000 mg | ORAL_CAPSULE | Freq: Four times a day (QID) | ORAL | Status: DC | PRN

## 2014-08-10 MED ORDER — OXYCODONE-ACETAMINOPHEN 5-325 MG PO TABS
2.0000 | ORAL_TABLET | ORAL | Status: DC | PRN
  Administered 2014-08-11 – 2014-08-12 (×7): 2 via ORAL
  Filled 2014-08-10 (×8): qty 2

## 2014-08-10 MED ORDER — FENTANYL CITRATE (PF) 100 MCG/2ML IJ SOLN
25.0000 ug | INTRAMUSCULAR | Status: DC | PRN
  Administered 2014-08-10: 50 ug via INTRAVENOUS
  Administered 2014-08-10 (×2): 25 ug via INTRAVENOUS

## 2014-08-10 MED ORDER — LIDOCAINE-EPINEPHRINE (PF) 2 %-1:200000 IJ SOLN
INTRAMUSCULAR | Status: AC
  Filled 2014-08-10: qty 20

## 2014-08-10 MED ORDER — BENZOCAINE-MENTHOL 20-0.5 % EX AERO: 1.0000 "application " | INHALATION_SPRAY | CUTANEOUS | Status: DC | PRN

## 2014-08-10 MED ORDER — ONDANSETRON HCL 4 MG PO TABS: 4.0000 mg | ORAL_TABLET | ORAL | Status: DC | PRN

## 2014-08-10 MED ORDER — FENTANYL CITRATE (PF) 100 MCG/2ML IJ SOLN
INTRAMUSCULAR | Status: DC | PRN
  Administered 2014-08-10 (×3): 50 ug via INTRAVENOUS
  Administered 2014-08-10: 100 ug via INTRAVENOUS

## 2014-08-10 MED ORDER — IBUPROFEN 600 MG PO TABS
600.0000 mg | ORAL_TABLET | Freq: Four times a day (QID) | ORAL | Status: DC
  Administered 2014-08-10 – 2014-08-12 (×7): 600 mg via ORAL
  Filled 2014-08-10 (×9): qty 1

## 2014-08-10 MED ORDER — PROPOFOL INFUSION 10 MG/ML OPTIME: INTRAVENOUS | Status: DC | PRN

## 2014-08-10 MED ORDER — FAMOTIDINE 20 MG PO TABS
40.0000 mg | ORAL_TABLET | Freq: Once | ORAL | Status: AC
  Administered 2014-08-10: 40 mg via ORAL
  Filled 2014-08-10: qty 2

## 2014-08-10 MED ORDER — WITCH HAZEL-GLYCERIN EX PADS
1.0000 "application " | MEDICATED_PAD | CUTANEOUS | Status: DC | PRN
  Administered 2014-08-12: 1 via TOPICAL

## 2014-08-10 MED ORDER — ONDANSETRON HCL 4 MG/2ML IJ SOLN: 4.0000 mg | INTRAMUSCULAR | Status: DC | PRN

## 2014-08-10 MED ORDER — METOCLOPRAMIDE HCL 10 MG PO TABS
10.0000 mg | ORAL_TABLET | Freq: Once | ORAL | Status: AC
  Administered 2014-08-10: 10 mg via ORAL
  Filled 2014-08-10: qty 1

## 2014-08-10 MED ORDER — ACETAMINOPHEN 325 MG PO TABS: 650.0000 mg | ORAL_TABLET | ORAL | Status: DC | PRN

## 2014-08-10 MED ORDER — FENTANYL CITRATE (PF) 250 MCG/5ML IJ SOLN
INTRAMUSCULAR | Status: AC
  Filled 2014-08-10: qty 25

## 2014-08-10 MED ORDER — PROPOFOL 500 MG/50ML IV EMUL
INTRAVENOUS | Status: DC | PRN
  Administered 2014-08-10 (×5): 20 mg via INTRAVENOUS

## 2014-08-10 MED ORDER — LACTATED RINGERS IV SOLN
INTRAVENOUS | Status: DC | PRN
  Administered 2014-08-10: 14:00:00 via INTRAVENOUS

## 2014-08-10 SURGICAL SUPPLY — 27 items
CHLORAPREP W/TINT 26ML (MISCELLANEOUS) ×5 IMPLANT
CLOTH BEACON ORANGE TIMEOUT ST (SAFETY) ×5 IMPLANT
CONTAINER PREFILL 10% NBF 15ML (MISCELLANEOUS) ×10 IMPLANT
DRSG OPSITE POSTOP 3X4 (GAUZE/BANDAGES/DRESSINGS) ×5 IMPLANT
ELECT REM PT RETURN 9FT ADLT (ELECTROSURGICAL) ×5
ELECTRODE REM PT RTRN 9FT ADLT (ELECTROSURGICAL) ×1 IMPLANT
GLOVE BIOGEL PI IND STRL 7.0 (GLOVE) ×3 IMPLANT
GLOVE BIOGEL PI INDICATOR 7.0 (GLOVE) ×2
GLOVE ECLIPSE 6.5 STRL STRAW (GLOVE) ×5 IMPLANT
GOWN STRL REUS W/TWL LRG LVL3 (GOWN DISPOSABLE) ×10 IMPLANT
LIQUID BAND (GAUZE/BANDAGES/DRESSINGS) ×5 IMPLANT
NEEDLE HYPO 22GX1.5 SAFETY (NEEDLE) ×5 IMPLANT
NS IRRIG 1000ML POUR BTL (IV SOLUTION) ×5 IMPLANT
PACK ABDOMINAL MINOR (CUSTOM PROCEDURE TRAY) ×5 IMPLANT
PENCIL BUTTON HOLSTER BLD 10FT (ELECTRODE) ×5 IMPLANT
SHEET LAVH (DRAPES) ×3 IMPLANT
SPONGE LAP 4X18 X RAY DECT (DISPOSABLE) ×3 IMPLANT
SUT MON AB 4-0 PS1 27 (SUTURE) ×5 IMPLANT
SUT PLAIN 1 NONE 54 (SUTURE) ×5 IMPLANT
SUT VIC AB 0 CT1 27 (SUTURE) ×5
SUT VIC AB 0 CT1 27XBRD ANBCTR (SUTURE) ×3 IMPLANT
SUT VIC AB 2-0 CT1 27 (SUTURE) ×10
SUT VIC AB 2-0 CT1 TAPERPNT 27 (SUTURE) ×2 IMPLANT
SUT VIC AB 3-0 PS2 18 (SUTURE) ×3 IMPLANT
SYR CONTROL 10ML LL (SYRINGE) ×5 IMPLANT
TOWEL OR 17X24 6PK STRL BLUE (TOWEL DISPOSABLE) ×10 IMPLANT
TRAY FOLEY CATH SILVER 14FR (SET/KITS/TRAYS/PACK) ×5 IMPLANT

## 2014-08-10 NOTE — Progress Notes (Signed)
The epidural catheter placed in the OR for the PPTL / removed with tip intact 08/10/2014 @ 1720.

## 2014-08-10 NOTE — Procedures (Signed)
  PREOP DIAGNOSIS: 1. Multiparous patient desiring permanent sterilization.        2. Skin tag in left vulvar causing discomfort, requesting removal of skin tag.    POSTOP DIAGNOSIS: Same as above  PROCEDURES:  1.  Post partum bilateral tubal salpingectomy.   2.  Excision of left labia majora skin tag.   SURGEON: Dr. Waymon Amato  ASSISTANT: Scrub tech: Shade Flood.  ANESTHESIA: Combined spinal epidural.  COMPLICATIONS: None  EBL: 10 mL  IV FLUID:  1143mL LR  URINE OUTPUT:  558mL  FINDINGS: Normal uterus. Normal fallopian tubes bilaterally.  Normal right ovary.   PROCEDURE:   Informed consent was obtained from the patient to undergo the procedures after discussing the risks benefits and alternatives of the procedure including risks of tubal regret, bleeding, infection and damage to organs.  She also understood that there are other temporary methods of birth control, and also female sterilization and she did not desire these other options.  She was taken to the operating room where anesthesia was administered without difficulty.  She was prepped abdominally in the usual sterile fashion. Foley catheter was placed in the bladder.    1% lidocaine with epinephrine was instilled in the infraumbilical area. The skin was incised with a scalpel about 5cm, and and entry into the abdomen was made through the subcutaneous layer, fascia and peritoneal layers using Allis clamps, hemostats, retractors and metzenbaum scissors.  The patient was tilted to her left side and the right fallopian tube was identified and followed up to the fimbria end through the incision. The right fallopian tube was then transected from the mesosalpinx after clamping with kelly clamps,transecting with scissors and suturing with 2-0 vicryl.  The patient was then tilted to her right side and the left fallopian tube identified, followed out to the fimbria end and transected similarly.  Excellent hemostasis was not added. The fascia was  then closed using 0 Vicryl in a running stitch.  The subcutaneous stitch was closed with 2-0 vicryl.  The skin was closed using 4-0 Monocryl subcuticular stitch. More 1% lidocaine with epi was instilled over the incision, total 10cc used.  Dermabond and Honey comb dressing were placed.  Attention was then turned to the vulvar where a 1cm skin colored  lesion was identified on left labia majora.  It was grasped and then excised at the base using a scalpel. The defect was closed off with 2-0 vicryl in interrupted stitch.  Silver nitrate was also applied for hemostasis. Lidocaine with epinephrine was also instilled around the incision.  Excellent hemostasis was noted.     The instrument and needle counts were correct. She was taken to the operating room in a stable condition.  SPECIMEN:  Left and right fallopian tubes. Left labia skin lesion.

## 2014-08-10 NOTE — Anesthesia Procedure Notes (Signed)
Spinal Patient location during procedure: OR Preanesthetic Checklist Completed: patient identified, site marked, surgical consent, pre-op evaluation, timeout performed, IV checked, risks and benefits discussed and monitors and equipment checked Spinal Block Patient position: sitting Prep: DuraPrep Patient monitoring: cardiac monitor, continuous pulse ox, blood pressure and heart rate Approach: midline Location: L3-4 Injection technique: catheter Needle Needle type: Tuohy and Sprotte  Needle gauge: 24 G Needle length: 12.7 cm Needle insertion depth: 6 cm Catheter type: closed end flexible Catheter size: 19 g Catheter at skin depth: 12 cm Additional Notes Spinal Dosage in OR  Bupivicaine ml       1.2 (-) asp heme/CSF

## 2014-08-10 NOTE — Anesthesia Preprocedure Evaluation (Signed)
Anesthesia Evaluation  Patient identified by MRN, date of birth, ID band Patient awake    Reviewed: Allergy & Precautions, NPO status , Patient's Chart, lab work & pertinent test results  History of Anesthesia Complications Negative for: history of anesthetic complications  Airway Mallampati: II  TM Distance: >3 FB Neck ROM: Full    Dental no notable dental hx. (+) Dental Advisory Given   Pulmonary asthma ,  breath sounds clear to auscultation  Pulmonary exam normal       Cardiovascular negative cardio ROS Normal cardiovascular examRhythm:Regular Rate:Normal     Neuro/Psych PSYCHIATRIC DISORDERS Anxiety Depression negative neurological ROS     GI/Hepatic negative GI ROS, Neg liver ROS,   Endo/Other  negative endocrine ROS  Renal/GU negative Renal ROS  negative genitourinary   Musculoskeletal negative musculoskeletal ROS (+)   Abdominal   Peds negative pediatric ROS (+)  Hematology negative hematology ROS (+)   Anesthesia Other Findings   Reproductive/Obstetrics Desires sterilization                             Anesthesia Physical  Anesthesia Plan  ASA: II  Anesthesia Plan: Epidural   Post-op Pain Management:    Induction:   Airway Management Planned: Natural Airway  Additional Equipment:   Intra-op Plan:   Post-operative Plan:   Informed Consent: I have reviewed the patients History and Physical, chart, labs and discussed the procedure including the risks, benefits and alternatives for the proposed anesthesia with the patient or authorized representative who has indicated his/her understanding and acceptance.     Plan Discussed with: Anesthesiologist, CRNA and Surgeon  Anesthesia Plan Comments:         Anesthesia Quick Evaluation

## 2014-08-10 NOTE — Progress Notes (Addendum)
Subjective: Postpartum Day 0: Vaginal delivery, no laceration Just transferred to [facility] recently--legs still somewhat numb.  No void yet. Scheduled for BTL today--OR schedule lists at 9:15am, but Dr. Alesia Richards in the office this am.   Patient also wants removal of skin tag on left inner thigh during procedure. Feeding:  Breast Contraceptive plan:  BTL--reviewed options, elects complete salpingectomy (hx of sister with ovarian cancer).  Objective: Vital signs in last 24 hours: Temp:  [98 F (36.7 C)-98.4 F (36.9 C)] 98.1 F (36.7 C) (08/02 0640) Pulse Rate:  [70-107] 79 (08/02 0640) Resp:  [16-20] 20 (08/02 0640) BP: (92-130)/(58-86) 110/64 mmHg (08/02 0640) SpO2:  [96 %-100 %] 100 % (08/02 0426) Weight:  [87.091 kg (192 lb)] 87.091 kg (192 lb) (08/01 1607)   Epidural cathether and IV still in place.  Physical Exam:  General: fatigued Lochia: appropriate Uterine Fundus: firm Perineum: Intact perineum DVT Evaluation: No evidence of DVT seen on physical exam. Negative Homan's sign.  CBC Latest Ref Rng 08/09/2014 06/28/2014 02/16/2014  WBC 4.0 - 10.5 K/uL 7.2 5.6 8.7  Hemoglobin 12.0 - 15.0 g/dL 9.9(L) 10.6(L) 13.4  Hematocrit 36.0 - 46.0 % 30.5(L) 31.1(L) 37.2  Platelets 150 - 400 K/uL 252 239 304     Assessment/Plan: Status post vaginal delivery day 0 Desires BTL, with complete salpingectomy, and removal of skin tag on inner thigh.  Stable Continue current care. Confirmed with Dr. Alesia Richards that tubal will be around 2pm--she will notify OR.  I notified patient and MBU RN.   New consent signed for bilateral complete salpingectomy and removal of skin tag on left inner thigh. Remains NPO. R&B of BTL and skin tag reviewed with patient, including failure of method, bleeding, infection, and damage to other organs.  Patient seems to understand these issues and wishes to proceed. Plan for discharge tomorrow    Donnel Saxon, [place] 08/10/2014, 8:12 AM    We discussed risks, benefits and  alternatives of the procedure including risks of tubal regret, bleeding, infection, damage to organs.  She declines other birth control options such as oral contraceptives, patch, IUD, depo, female sterilization.  All questions were answered.  Dr. Alesia Richards.

## 2014-08-10 NOTE — Anesthesia Postprocedure Evaluation (Signed)
  Anesthesia Post-op Note  Patient: [name] [name]  Procedure(s) Performed: Procedure(s) with comments: VULVAR LESION (Left) - vulva skin tag removal  BILATERAL SALPINGECTOMY (Bilateral) Patient is awake and responsive. Pain and nausea are reasonably well controlled. Vital signs are stable and clinically acceptable. Oxygen saturation is clinically acceptable. There are no apparent anesthetic complications at this time. Patient is ready for discharge.

## 2014-08-10 NOTE — Anesthesia Postprocedure Evaluation (Signed)
  Anesthesia Post-op Note  Patient: [name] [name]  Procedure(s) Performed: * No procedures listed *  Patient Location: PACU and Mother/Baby  Anesthesia Type:Epidural  Level of Consciousness: awake, alert  and oriented  Airway and Oxygen Therapy: Patient Spontanous Breathing  Post-op Pain: mild  Post-op Assessment: Patient's Cardiovascular Status Stable, Respiratory Function Stable, No signs of Nausea or vomiting, Adequate PO intake, Pain level controlled, No headache and Patient able to bend at knees              Post-op Vital Signs: Reviewed and stable  Last Vitals:  Filed Vitals:   08/10/14 0740  BP: 117/66  Pulse: 78  Temp: 36.7 C  Resp: 20    Complications: No apparent anesthesia complications

## 2014-08-10 NOTE — Progress Notes (Signed)
UR chart review completed.  

## 2014-08-10 NOTE — Transfer of Care (Signed)
Immediate Anesthesia Transfer of Care Note  Patient: [name] [name]  Procedure(s) Performed: Procedure(s) with comments: VULVAR LESION (Left) - vulva skin tag removal  BILATERAL SALPINGECTOMY (Bilateral)  Patient Location: PACU  Anesthesia Type:Spinal  Level of Consciousness: awake, alert  and oriented  Airway & Oxygen Therapy: Patient Spontanous Breathing and Patient connected to nasal cannula oxygen  Post-op Assessment: Report given to RN and Post -op Vital signs reviewed and stable  Post vital signs: Reviewed and stable  Last Vitals:  Filed Vitals:   08/10/14 1305  BP: 114/81  Pulse: 54  Temp: 36.7 C  Resp: 18    Complications: No apparent anesthesia complications

## 2014-08-10 NOTE — Progress Notes (Signed)
Labor Progress  Subjective: Comfortable with epidural, wide awake, unable to sleep  Objective: BP 92/60 mmHg  Pulse 78  Temp(Src) 98 F (36.7 C) (Oral)  Resp 18  Ht 5\' 3"  (1.6 m)  Wt 192 lb (87.091 kg)  BMI 34.02 kg/m2  SpO2 99%  LMP 11/02/2013 (Approximate)     FHT: 135, moderate variability, + accel, no decel CTX:  irregular, every 4-7 minutes Uterus gravid, soft non tender SVE:  Dilation: 3 Effacement (%): 80 Station: Ballotable Exam by:: Makynzi Eastland, CNM Pitocin at 56mUn/min  Assessment:  IUP at 40.1 weeks NICHD: Category Membranes:  BBW Labor progress: IOL Pitocin Augmentation GBS: positive   Plan: Continue labor plan Continuous monitoring Rest Frequent position changes to facilitate fetal rotation and descent. Will reassess with cervical exam at 0500 or earlier if necessary Continue pitocin per protocol      Ilani Otterson, CNM, MSN 08/10/2014. 2:18 AM

## 2014-08-10 NOTE — Lactation Note (Signed)
This note was copied from the chart of [name] Nazareth. Lactation Consultation Note  Patient Name: [name] [name] [id number] Date: 08/10/2014 Reason for consult: Initial assessment  With this mom of a term baby, now 29 hours old. Mom has already breast fed 3 times, and baby has voided once. Mom is going for a BTL at 2 pm, so mom was encouraged to continue breast feeding baby often, and if possible, tohand express colostrum to leave to feed baby while mom is in surgery, if needed.I also gave mom a DEP kit for her pump at home. Mom breast fed her first baby for 21 months, and reports breast feeding going well. She was using cradle hold, and I advised her to try cross cradle, to see if this latch is deeper. Mom knows to call for lactation for questions/concerns as needed.    Maternal Data Formula Feeding for Exclusion: No Has patient been taught Hand Expression?: Yes Does the patient have breastfeeding experience prior to this delivery?: Yes  Feeding Feeding Type: Breast Fed Length of feed: 10 min  LATCH Score/Interventions                      Lactation Tools Discussed/Used WIC Program: Yes (mom given pump kit for her DEP )   Consult Status Consult Status: Follow-up Date: 08/11/14 Follow-up type: In-patient    Tonna Corner 08/10/2014, 11:51 AM

## 2014-08-11 ENCOUNTER — Encounter (HOSPITAL_COMMUNITY): Payer: Self-pay | Admitting: Obstetrics & Gynecology

## 2014-08-11 LAB — CBC
HCT: 29.2 % — ABNORMAL LOW (ref 36.0–46.0)
Hemoglobin: 9.4 g/dL — ABNORMAL LOW (ref 12.0–15.0)
MCH: 28.7 pg (ref 26.0–34.0)
MCHC: 32.2 g/dL (ref 30.0–36.0)
MCV: 89 fL (ref 78.0–100.0)
Platelets: 243 10*3/uL (ref 150–400)
RBC: 3.28 MIL/uL — AB (ref 3.87–5.11)
RDW: 13.9 % (ref 11.5–15.5)
WBC: 8.7 10*3/uL (ref 4.0–10.5)

## 2014-08-11 MED ORDER — BISACODYL 10 MG RE SUPP
10.0000 mg | Freq: Every day | RECTAL | Status: DC | PRN
  Administered 2014-08-11: 10 mg via RECTAL
  Filled 2014-08-11: qty 1

## 2014-08-11 MED ORDER — TRAMADOL HCL 50 MG PO TABS
50.0000 mg | ORAL_TABLET | Freq: Once | ORAL | Status: AC
  Administered 2014-08-11: 50 mg via ORAL
  Filled 2014-08-11: qty 1

## 2014-08-11 MED ORDER — SIMETHICONE 80 MG PO CHEW
80.0000 mg | CHEWABLE_TABLET | Freq: Three times a day (TID) | ORAL | Status: DC
  Administered 2014-08-11 – 2014-08-12 (×4): 80 mg via ORAL
  Filled 2014-08-11 (×4): qty 1

## 2014-08-11 NOTE — Progress Notes (Signed)
Subjective: Postpartum Day 1, Post-op bilateral salpingectomy day 1: Vaginal delivery, no laceration C/o right sided abdominal pain, just lateral to umbilicus.  Denies flatus, no N/V.  Ate small snack during night, hungry this am. Has had minimal ambulation since BTL yesterday afternoon. Per conversation with Dr. Alesia Richards yesterday, patient's tubes were very long, with removal of entire tube for contraception. Feeding:  Breast Contraceptive plan:  BTL done 08/10/14  Baby under bili blanket in room.  Objective: Vital signs in last 24 hours: Temp:  [97.5 F (36.4 C)-98.3 F (36.8 C)] 98 F (36.7 C) (08/03 0414) Pulse Rate:  [54-103] 89 (08/03 0414) Resp:  [13-20] 20 (08/03 0414) BP: (95-122)/(62-81) 104/64 mmHg (08/03 0414) SpO2:  [94 %-99 %] 99 % (08/02 1800)   Filed Vitals:   08/10/14 1800 08/10/14 1950 08/11/14 0034 08/11/14 0414  BP: 115/75 115/78 106/63 104/64  Pulse: 75 87 97 89  Temp: 97.5 F (36.4 C) 97.7 F (36.5 C) 97.8 F (36.6 C) 98 F (36.7 C)  TempSrc:  Oral Oral Oral  Resp: 20 20 20 20   Height:      Weight:      SpO2: 99%      Orthostatics stable  Physical Exam:  General: mild distress when changing positions and standing. Abd--tympanic gaseous distension, + bowel sounds in all quadrants Lochia: appropriate Uterine Fundus: firm Perineum: Intact Incision sites:  Small honeycomb dressing CDI over tubal site.  Site of skin tag removal CDI DVT Evaluation: No evidence of DVT seen on physical exam.  Negative Homan's  CBC Latest Ref Rng 08/11/2014 08/09/2014 06/28/2014  WBC 4.0 - 10.5 K/uL 8.7 7.2 5.6  Hemoglobin 12.0 - 15.0 g/dL 9.4(L) 9.9(L) 10.6(L)  Hematocrit 36.0 - 46.0 % 29.2(L) 30.5(L) 31.1(L)  Platelets 150 - 400 K/uL 243 252 239     Assessment/Plan: Status post vaginal delivery day 1, post-op salpingectomy and skin tag removal day 1 Abdominal gas pain Chronic anemia--stable, on Fe at home Stable involution Continue current care. Reiterated need for  ambulation to assist with passing of gas. On Simethicone TID Dulcolax supp today Prune/apple juice combo today Plan for discharge tomorrow    [name] [name] 08/11/2014, 7:45 AM

## 2014-08-11 NOTE — Progress Notes (Addendum)
S: Call received from RN re: pt c/o 8/10 right sided pain with no relief from Percocet/Motrin combo. Pt requesting a different medication. Per RN, pt has only been up once this shift to void.  O:  Today's Vitals   08/11/14 0034 08/11/14 0306 08/11/14 0414 08/11/14 0415  BP: 106/63  104/64   Pulse: 97  89   Temp: 97.8 F (36.6 C)  98 F (36.7 C)   TempSrc: Oral  Oral   Resp: 20  20   Height:      Weight:      SpO2:      PainSc:  Asleep  8     Foley out at 17:15 PM on 08/10/14. Has only voided twice since foley removed. 2nd void not measured as pt voided in toilet versus hat, but amount quantity sufficient per RN.   A: 34 yo G3 now P2012 s/p vaginal delivery and BTL. Suspect right sided pain is due to gas. Pt has been out of bed only once this shift, and that was to void.  P: Simethicone tab now. Tramadol tab now. Informed medication may not be beneficial as my suspicion is that her pain is gas related due to her immobility. Pt's nurse has attempted to get her out of bed this shift. Reiterated that she strongly needs to ambulate.  Farrel Gordon, CNM 08/11/14, 4:52 AM  ADDENDUM:  Out of bed to void -- voided 300 ml. Per pt report has been drinking plenty of water but does not feel the urge to urinate s/p foley. Tramadol ineffective as initially thought. While in room, pt moaning and still in bed despite multiple requests to ambulate the halls. On visual inspection, abdomen distended --- she would not allow me to touch her abdomen due to her discomfort. Will give Simethicone on schedule. At this point, pt does not meet criteria for discharge. Informed that she will be discharged tomorrow therefore it is imperative that she ambulates. She verbalized understanding.  08/11/14, 06:25 AM

## 2014-08-11 NOTE — Progress Notes (Signed)
In to check on patient status--she is walking around in room, has showered, ate breakfast. Feeling better, less gas pain.  Has not received Dulcolax yet, but still plans to use. + flatus. Received Percocet at [place], [place], [place] 0725. Due Ibuprophen at 1230.  Baby just had repeat bili level drawn.  Will continue to observe.  Donnel Saxon, CNM 08/11/14 11a

## 2014-08-11 NOTE — Plan of Care (Signed)
Problem: Phase II Progression Outcomes Goal: Pain controlled on oral analgesia Patient has expressed moderate to severe levels of right mid and lower quadrant abdominal pain, sharp, cramping in nature. As of 1900, she has pass some flatus and two small stools since receiving the Dulcolax suppository at 1240. She has has been OOB in her room only, showering in the afternoon, but has not ambulated in the hallway as instructed/encouraged to do.
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
## 2014-08-11 NOTE — Anesthesia Postprocedure Evaluation (Signed)
  Anesthesia Post-op Note  Patient: [name] [name]  Procedure(s) Performed: Procedure(s) with comments: VULVAR LESION (Left) - vulva skin tag removal  BILATERAL SALPINGECTOMY (Bilateral)  Patient Location: Mother/Baby  Anesthesia Type:Epidural  Level of Consciousness: awake, alert  and oriented  Airway and Oxygen Therapy: Patient Spontanous Breathing  Post-op Pain: none  Post-op Assessment: Post-op Vital signs reviewed and Patient's Cardiovascular Status Stable LLE Motor Response: Purposeful movement LLE Sensation: Tingling RLE Motor Response: Purposeful movement RLE Sensation: Tingling      Post-op Vital Signs: Reviewed and stable  Last Vitals:  Filed Vitals:   08/11/14 0414  BP: 104/64  Pulse: 89  Temp: 36.7 C  Resp: 20    Complications: No apparent anesthesia complications

## 2014-08-12 MED ORDER — DOCUSATE SODIUM 100 MG PO CAPS: 100.0000 mg | ORAL_CAPSULE | Freq: Two times a day (BID) | ORAL | Status: DC | PRN

## 2014-08-12 MED ORDER — OXYCODONE-ACETAMINOPHEN 5-325 MG PO TABS: 1.0000 | ORAL_TABLET | ORAL | Status: DC | PRN

## 2014-08-12 MED ORDER — MAGNESIUM HYDROXIDE 400 MG/5ML PO SUSP
30.0000 mL | Freq: Every day | ORAL | Status: DC | PRN
  Administered 2014-08-12: 30 mL via ORAL
  Filled 2014-08-12: qty 30

## 2014-08-12 MED ORDER — IBUPROFEN 600 MG PO TABS: 600.0000 mg | ORAL_TABLET | Freq: Four times a day (QID) | ORAL | Status: DC | PRN

## 2014-08-12 MED ORDER — SIMETHICONE 80 MG PO CHEW: 80.0000 mg | CHEWABLE_TABLET | Freq: Three times a day (TID) | ORAL | Status: DC

## 2014-08-12 NOTE — Lactation Note (Signed)
This note was copied from the chart of [name] [name]. Lactation Consultation Note  Patient Name: [name] Victoriana Aziz FSELT'R Date: 08/12/2014 Reason for consult: Follow-up assessment;Hyperbilirubinemia Assisted Mom with latching baby in football hold while on bili light. Mom has had some nipple tenderness but this has improved. Breasts are starting to fill. Mom is experienced BF, questions answered. Engorgement care reviewed if needed. Advised of OP services and support group. Weight loss at 5%, bili level at 7.3 at 49 hours of age - low risk zone. On double photo therapy. Baby has had 4 stools in life, 2 in past 24 hours. Has had 7 voids in life, 2 in past 24 hours. Encouraged Mom to monitor voids/stools. Encouraged to keep baby nursing for 15-20 minutes, both breasts when possible with feedings.   Maternal Data    Feeding Feeding Type: Breast Fed Length of feed: 10 min  LATCH Score/Interventions Latch: Grasps breast easily, tongue down, lips flanged, rhythmical sucking.  Audible Swallowing: Spontaneous and intermittent Intervention(s): Skin to skin Intervention(s): Skin to skin  Type of Nipple: Everted at rest and after stimulation  Comfort (Breast/Nipple): Soft / non-tender     Hold (Positioning): Assistance needed to correctly position infant at breast and maintain latch. Intervention(s): Skin to skin;Position options;Support Pillows;Breastfeeding basics reviewed  LATCH Score: 9  Lactation Tools Discussed/Used Tools: Pump Breast pump type: Manual   Consult Status Consult Status: Complete Date: 08/12/14 Follow-up type: In-patient    Katrine Coho 08/12/2014, 9:18 AM

## 2014-08-12 NOTE — Progress Notes (Signed)
Patient states has never had any meds for asthma or had an official diagnosis of asthma. She is declining the pneumonia vaccine for this reason.

## 2014-08-12 NOTE — Anesthesia Postprocedure Evaluation (Signed)
  Anesthesia Post-op Note  Patient: [name] [name]  Procedure(s) Performed: Procedure(s) with comments: VULVAR LESION (Left) - vulva skin tag removal  BILATERAL SALPINGECTOMY (Bilateral) Patient is awake and responsive. Pain and nausea are reasonably well controlled. Vital signs are stable and clinically acceptable. Oxygen saturation is clinically acceptable. There are no apparent anesthetic complications at this time. Patient is ready for discharge.

## 2014-08-12 NOTE — Discharge Instructions (Signed)
Postpartum Care After Vaginal Delivery °After you deliver your newborn (postpartum period), the usual stay in the hospital is 24-72 hours. If there were problems with your labor or delivery, or if you have other medical problems, you might be in the hospital longer.  °While you are in the hospital, you will receive help and instructions on how to care for yourself and your newborn during the postpartum period.  °While you are in the hospital: °· Be sure to tell your nurses if you have pain or discomfort, as well as where you feel the pain and what makes the pain worse. °· If you had an incision made near your vagina (episiotomy) or if you had some tearing during delivery, the nurses may put ice packs on your episiotomy or tear. The ice packs may help to reduce the pain and swelling. °· If you are breastfeeding, you may feel uncomfortable contractions of your uterus for a couple of weeks. This is normal. The contractions help your uterus get back to normal size. °· It is normal to have some bleeding after delivery. °· For the first 1-3 days after delivery, the flow is red and the amount may be similar to a period. °· It is common for the flow to start and stop. °· In the first few days, you may pass some small clots. Let your nurses know if you begin to pass large clots or your flow increases. °· Do not  flush blood clots down the toilet before having the nurse look at them. °· During the next 3-10 days after delivery, your flow should become more watery and pink or brown-tinged in color. °· Ten to fourteen days after delivery, your flow should be a small amount of yellowish-white discharge. °· The amount of your flow will decrease over the first few weeks after delivery. Your flow may stop in 6-8 weeks. Most women have had their flow stop by 12 weeks after delivery. °· You should change your sanitary pads frequently. °· Wash your hands thoroughly with soap and water for at least 20 seconds after changing pads, using  the toilet, or before holding or feeding your newborn. °· You should feel like you need to empty your bladder within the first 6-8 hours after delivery. °· In case you become weak, lightheaded, or faint, call your nurse before you get out of bed for the first time and before you take a shower for the first time. °· Within the first few days after delivery, your breasts may begin to feel tender and full. This is called engorgement. Breast tenderness usually goes away within 48-72 hours after engorgement occurs. You may also notice milk leaking from your breasts. If you are not breastfeeding, do not stimulate your breasts. Breast stimulation can make your breasts produce more milk. °· Spending as much time as possible with your newborn is very important. During this time, you and your newborn can feel close and get to know each other. Having your newborn stay in your room (rooming in) will help to strengthen the bond with your newborn.  It will give you time to get to know your newborn and become comfortable caring for your newborn. °· Your hormones change after delivery. Sometimes the hormone changes can temporarily cause you to feel sad or tearful. These feelings should not last more than a few days. If these feelings last longer than that, you should talk to your caregiver. °· If desired, talk to your caregiver about methods of family planning or contraception. °·   Talk to your caregiver about immunizations. Your caregiver may want you to have the following immunizations before leaving the hospital:  Tetanus, diphtheria, and pertussis (Tdap) or tetanus and diphtheria (Td) immunization. It is very important that you and your family (including grandparents) or others caring for your newborn are up-to-date with the Tdap or Td immunizations. The Tdap or Td immunization can help protect your newborn from getting ill.  Rubella immunization.  Varicella (chickenpox) immunization.  Influenza immunization. You should  receive this annual immunization if you did not receive the immunization during your pregnancy. Document Released: 10/22/2006 Document Revised: 09/19/2011 Document Reviewed: 08/22/2011 [facility] Patient Information 2015 [place], [place]. This information is not intended to replace advice given to you by your health care provider. Make sure you discuss any questions you have with your health care provider.  Postpartum Tubal Ligation Care After Refer to this sheet in the next few weeks. These instructions provide you with information on caring for yourself after your procedure. Your caregiver may also give you more specific instructions. Your treatment has been planned according to current medical practices, but problems sometimes occur. Call your caregiver if you have any problems or questions after your procedure. HOME CARE INSTRUCTIONS   Rest the remainder of the day.  Only take over-the-counter or prescription medicines for pain, discomfort, or fever as directed by your caregiver. Do not take aspirin. It can cause bleeding.  Gradually resume daily activities, diet, rest, driving, and work.  Avoid sexual intercourse for 2 weeks or as directed.  Do not drive while taking pain medicine.  Do not lift anything over 5 pounds for 2 weeks or as directed.  Only take showers, not baths, until you are seen by your caregiver.  Change bandages (dressings) as directed.  Take your temperature twice a day and record it.  Try to have help for the first 7-10 days for your household needs.  Return to your caregiver to get your stitches (sutures) removed and for follow-up visits as directed. SEEK MEDICAL CARE IF:   You have redness, swelling, or increasing pain in the wound.  You have drainage from the wound lasting longer than 1 day.  Your pain is getting worse.  You have a rash.  You become dizzy or lightheaded.  You have a reaction to your medicine.  You need stronger medicine or a change  in your pain medicine.  You notice a bad smell coming from the wound or dressing.  Your wound breaks open after the sutures have been removed.  You are constipated. SEEK IMMEDIATE MEDICAL CARE IF:   You faint.  You have a fever.  You have increasing abdominal pain.  You have severe pain in your shoulders.  You have bleeding or drainage from the suture sites or vagina following surgery.  You have shortness of breath or difficulty breathing.  You have chest or leg pain.  You have persistent nausea, vomiting, or diarrhea. MAKE SURE YOU:   Understand these instructions.  Watch your condition.  Get help right away if you are not doing well or get worse. Document Released: 06/26/2011 Document Reviewed: 06/26/2011 [facility] Patient Information 2015 [place]. This information is not intended to replace advice given to you by your health care provider. Make sure you discuss any questions you have with your health care provider.

## 2014-08-17 ENCOUNTER — Encounter (HOSPITAL_COMMUNITY): Payer: Self-pay | Admitting: *Deleted

## 2014-08-17 ENCOUNTER — Inpatient Hospital Stay (HOSPITAL_COMMUNITY)
Admission: AD | Admit: 2014-08-17 | Discharge: 2014-08-17 | Disposition: A | Discharge status: Home / Self Care | Payer: Medicaid Other | Source: Ambulatory Visit | Attending: Obstetrics and Gynecology | Admitting: Obstetrics and Gynecology

## 2014-08-17 DIAGNOSIS — O9089 Other complications of the puerperium, not elsewhere classified: Secondary | ICD-10-CM | POA: Diagnosis present

## 2014-08-17 DIAGNOSIS — M7989 Other specified soft tissue disorders: Secondary | ICD-10-CM

## 2014-08-17 LAB — COMPREHENSIVE METABOLIC PANEL
ALT: 21 U/L (ref 14–54)
AST: 19 U/L (ref 15–41)
Albumin: 2.4 g/dL — ABNORMAL LOW (ref 3.5–5.0)
Alkaline Phosphatase: 107 U/L (ref 38–126)
Anion gap: 8 (ref 5–15)
BILIRUBIN TOTAL: 0.6 mg/dL (ref 0.3–1.2)
BUN: 11 mg/dL (ref 6–20)
CO2: 19 mmol/L — AB (ref 22–32)
CREATININE: 0.59 mg/dL (ref 0.44–1.00)
Calcium: 8.1 mg/dL — ABNORMAL LOW (ref 8.9–10.3)
Chloride: 115 mmol/L — ABNORMAL HIGH (ref 101–111)
Glucose, Bld: 80 mg/dL (ref 65–99)
POTASSIUM: 4 mmol/L (ref 3.5–5.1)
SODIUM: 142 mmol/L (ref 135–145)
Total Protein: 6.2 g/dL — ABNORMAL LOW (ref 6.5–8.1)

## 2014-08-17 LAB — URINALYSIS, ROUTINE W REFLEX MICROSCOPIC
Bilirubin Urine: NEGATIVE
Glucose, UA: NEGATIVE mg/dL
Ketones, ur: NEGATIVE mg/dL
NITRITE: NEGATIVE
PH: 6 (ref 5.0–8.0)
Protein, ur: NEGATIVE mg/dL
SPECIFIC GRAVITY, URINE: 1.025 (ref 1.005–1.030)
Urobilinogen, UA: 1 mg/dL (ref 0.0–1.0)

## 2014-08-17 LAB — URINE MICROSCOPIC-ADD ON

## 2014-08-17 LAB — CBC
HCT: 31.4 % — ABNORMAL LOW (ref 36.0–46.0)
HEMOGLOBIN: 10.2 g/dL — AB (ref 12.0–15.0)
MCH: 28.7 pg (ref 26.0–34.0)
MCHC: 32.5 g/dL (ref 30.0–36.0)
MCV: 88.5 fL (ref 78.0–100.0)
Platelets: 309 10*3/uL (ref 150–400)
RBC: 3.55 MIL/uL — AB (ref 3.87–5.11)
RDW: 14.2 % (ref 11.5–15.5)
WBC: 5.5 10*3/uL (ref 4.0–10.5)

## 2014-08-17 LAB — PROTEIN / CREATININE RATIO, URINE
Creatinine, Urine: 107 mg/dL
Creatinine, Urine: 72 mg/dL
Protein Creatinine Ratio: 0.43 mg/mg{Cre} — ABNORMAL HIGH (ref 0.00–0.15)
Protein Creatinine Ratio: 0.1 mg/mg{Cre} (ref 0.00–0.15)
TOTAL PROTEIN, URINE: 7 mg/dL
Total Protein, Urine: 46 mg/dL

## 2014-08-17 LAB — LACTATE DEHYDROGENASE: LDH: 215 U/L — ABNORMAL HIGH (ref 98–192)

## 2014-08-17 LAB — URIC ACID: Uric Acid, Serum: 5.5 mg/dL (ref 2.3–6.6)

## 2014-08-17 NOTE — MAU Note (Deleted)
+  HA past 2 days, seeing spots and flashes of light, increase in swelling in lower extremities, reports sharp shooting pain in RUQ

## 2014-08-17 NOTE — MAU Note (Signed)
Pt presents to MAU with complaints of an increase in blood pressure over the last couple of days. Pt had a vaginal delivery on August the 2nd. Pt states she had high blood pressure after her last pregnancy 3 years ago and is concerned because she is having the same symptoms now that she did back then. Denies any headache
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
## 2015-03-23 ENCOUNTER — Encounter: Payer: Self-pay | Admitting: Physical Therapy

## 2015-03-23 ENCOUNTER — Ambulatory Visit: Payer: Medicaid Other | Attending: Family Medicine | Admitting: Physical Therapy

## 2015-03-23 DIAGNOSIS — M545 Low back pain, unspecified: Secondary | ICD-10-CM
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
## 2015-03-31 ENCOUNTER — Encounter: Payer: Self-pay | Admitting: Internal Medicine

## 2015-05-26 ENCOUNTER — Encounter: Payer: Self-pay | Admitting: Internal Medicine

## 2015-05-26 ENCOUNTER — Ambulatory Visit (INDEPENDENT_AMBULATORY_CARE_PROVIDER_SITE_OTHER): Payer: Medicaid Other | Admitting: Internal Medicine

## 2015-05-26 VITALS — BP 108/62 | HR 88 | Ht 62.25 in | Wt 199.1 lb

## 2015-05-26 DIAGNOSIS — K5909 Other constipation: Secondary | ICD-10-CM

## 2015-05-26 DIAGNOSIS — N816 Rectocele: Secondary | ICD-10-CM | POA: Diagnosis not present

## 2015-05-26 DIAGNOSIS — K5900 Constipation, unspecified: Secondary | ICD-10-CM | POA: Diagnosis not present

## 2015-05-26 DIAGNOSIS — K642 Third degree hemorrhoids: Secondary | ICD-10-CM

## 2015-05-26 DIAGNOSIS — K602 Anal fissure, unspecified: Secondary | ICD-10-CM | POA: Diagnosis not present

## 2015-05-26 DIAGNOSIS — K648 Other hemorrhoids: Secondary | ICD-10-CM | POA: Diagnosis not present

## 2015-05-26 MED ORDER — DILTIAZEM GEL 2 %: 1.0000 "application " | Freq: Three times a day (TID) | CUTANEOUS | Status: AC

## 2015-05-26 NOTE — Patient Instructions (Signed)
  Today we are giving you an anal fissure and hemorrhoid handout to read and follow.   Dr Carlean Purl recommends that you complete a bowel purge (to clean out your bowels). Please do the following: Purchase a bottle of Miralax over the counter as well as a box of 5 mg dulcolax tablets. Take 4 dulcolax tablets. Wait 1 hour. You will then drink 6-8 capfuls of Miralax mixed in an adequate amount of water/juice/gatorade (you may choose which of these liquids to drink) over the next 2-3 hours. You should expect results within 1 to 6 hours after completing the bowel purge.    After the purge take 17grams of Miralax daily.  You may increase the dose as needed.  We have sent the following medications to [facility] for you to pick up at your convenience: Diltiazem gel   Follow up with Dr Carlean Purl in July.    I appreciate the opportunity to care for you.
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
## 2015-08-01 ENCOUNTER — Ambulatory Visit: Payer: Medicaid Other | Admitting: Internal Medicine

## 2015-09-20 IMAGING — US US OB TRANSVAGINAL
1 series · 14 of 26 positions shown · non-contrast
Comparison: 12/07/2013

CLINICAL DATA: Follow-up viability.  Cramping last night.

EXAM:
TRANSVAGINAL OB ULTRASOUND
TECHNIQUE: Transvaginal ultrasound was performed for complete evaluation of the
gestation as well as the maternal uterus, adnexal regions, and
pelvic cul-de-sac.

[Series 1: us ob transvaginal · 26 acquisitions, 14 frames shown]
[im 1/26]
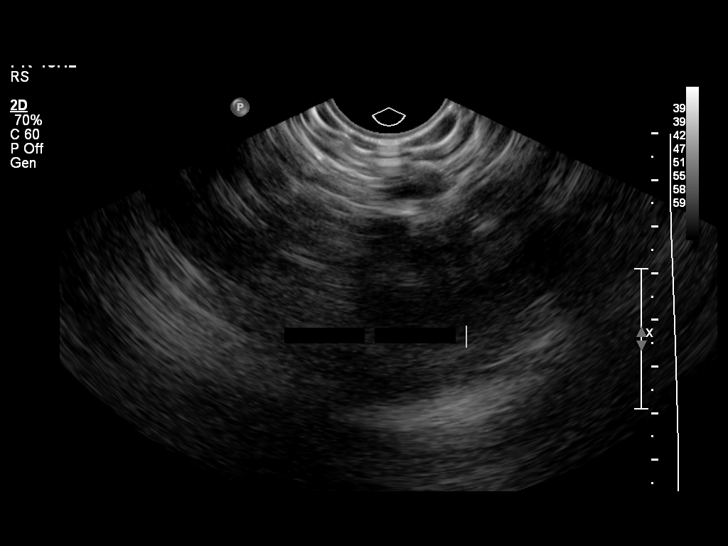
[im 3/26]
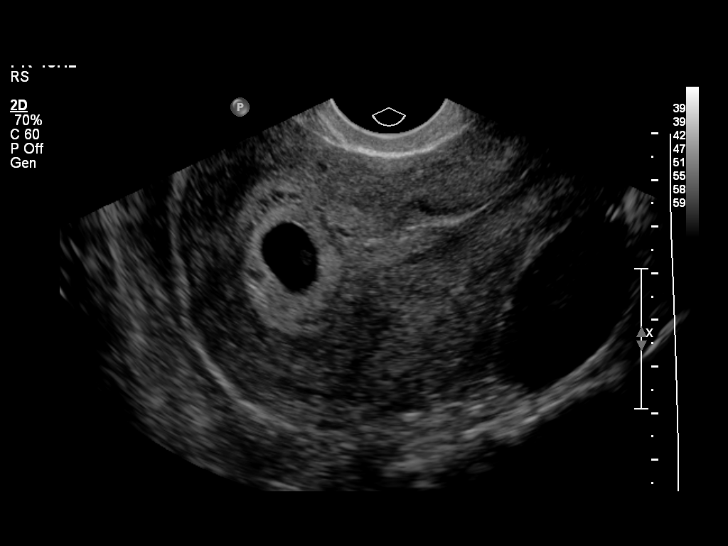
[im 5/26]
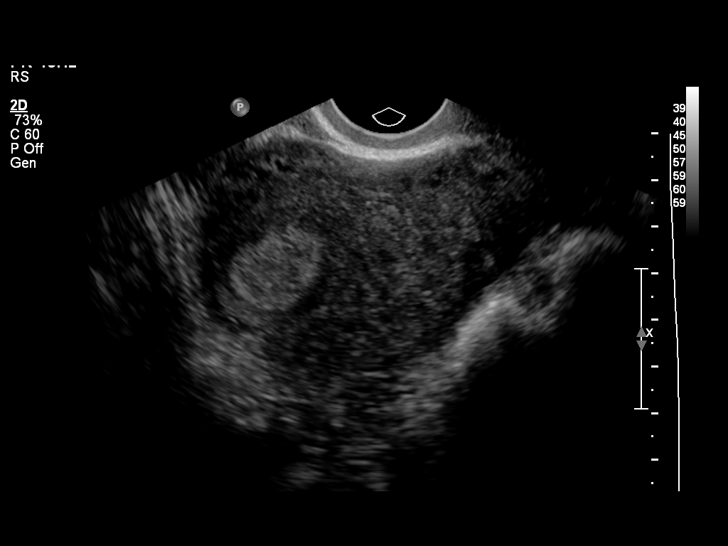
[im 7/26]
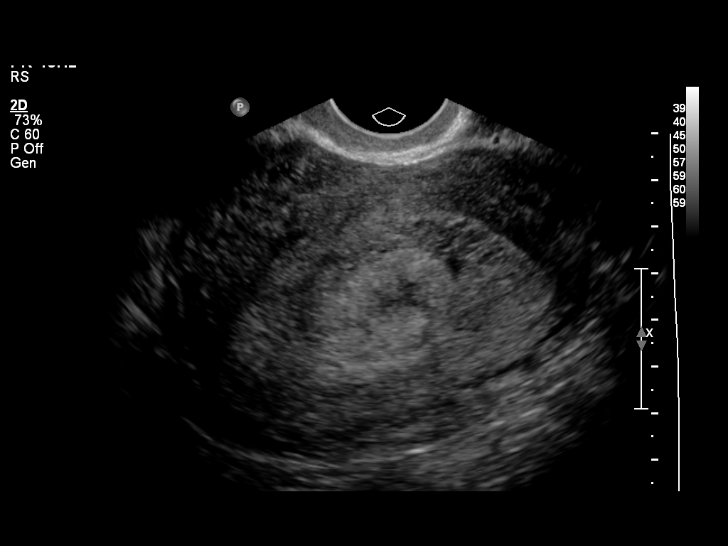
[im 9/26]
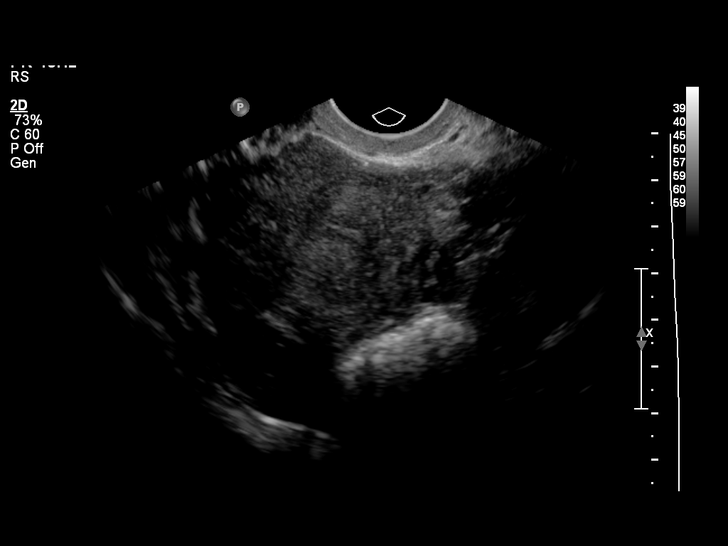
[im 11/26]
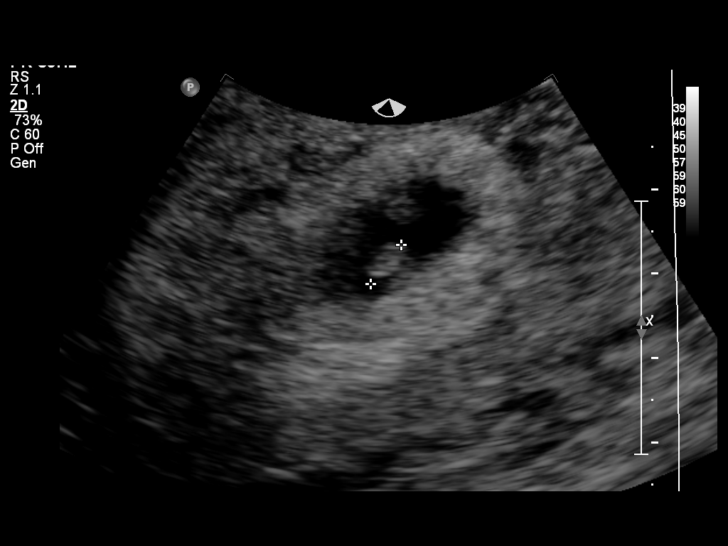
[im 13/26]
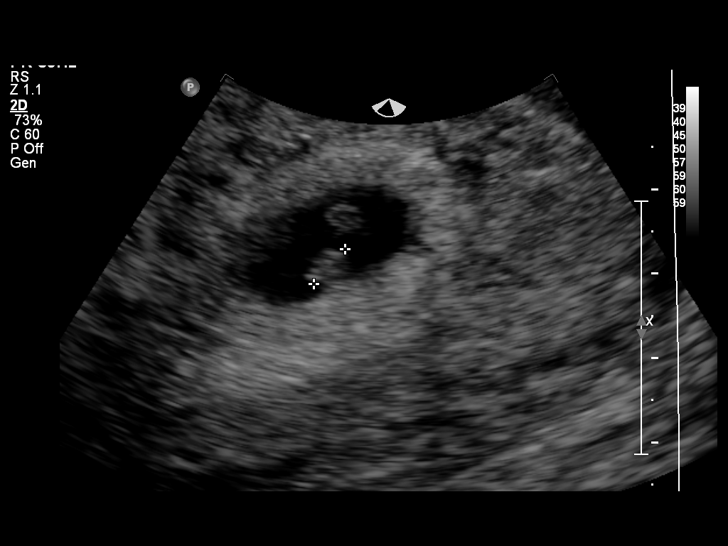
[im 14/26]
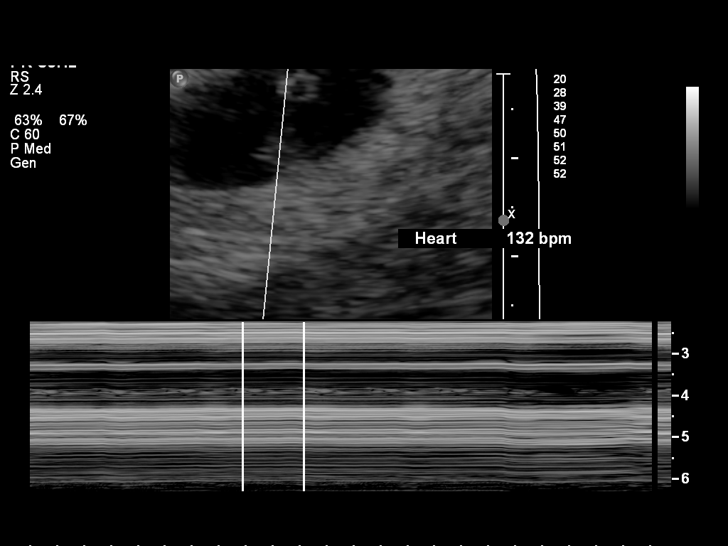
[im 16/26]
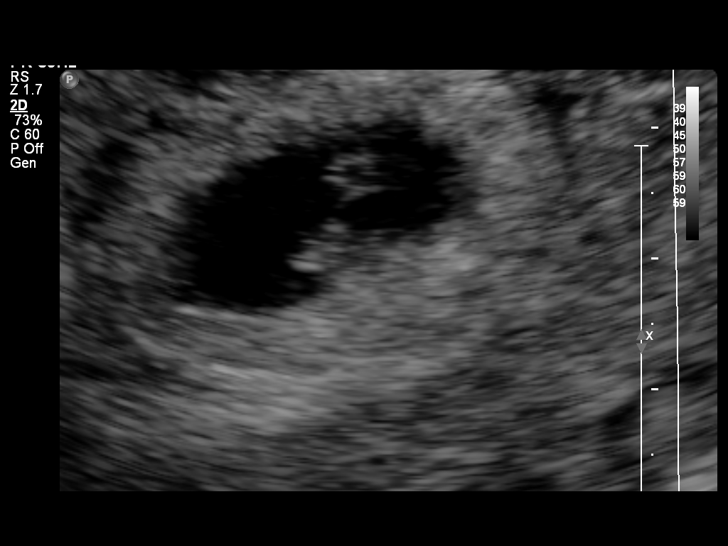
[im 18/26]
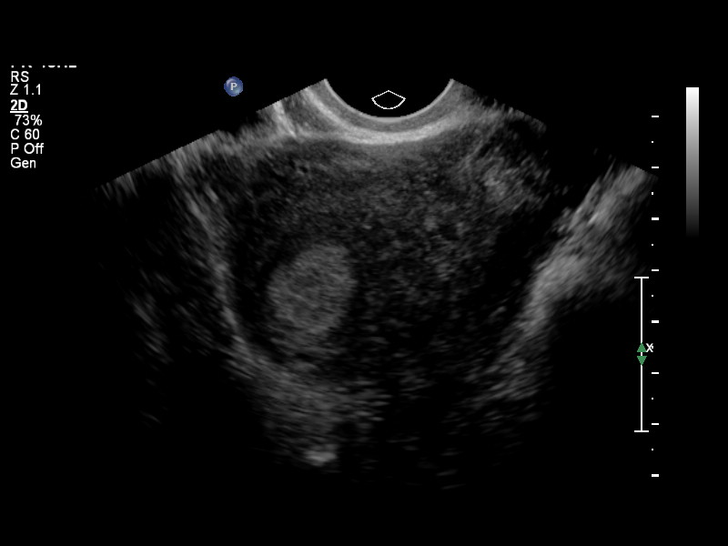
[im 20/26]
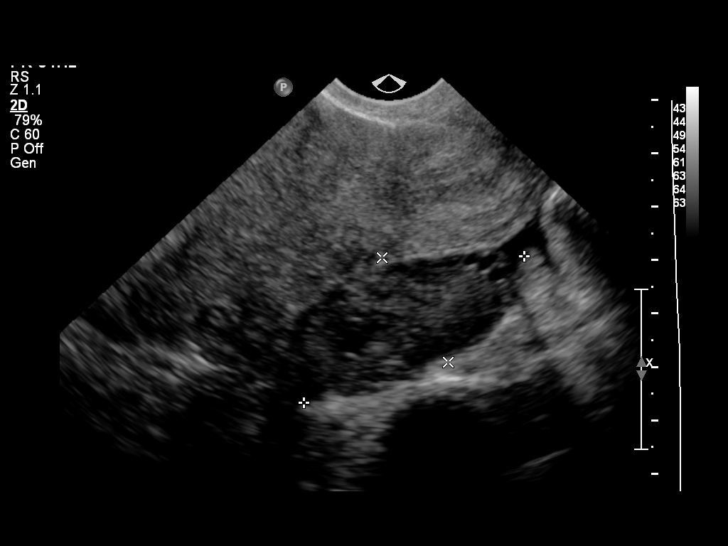
[im 22/26]
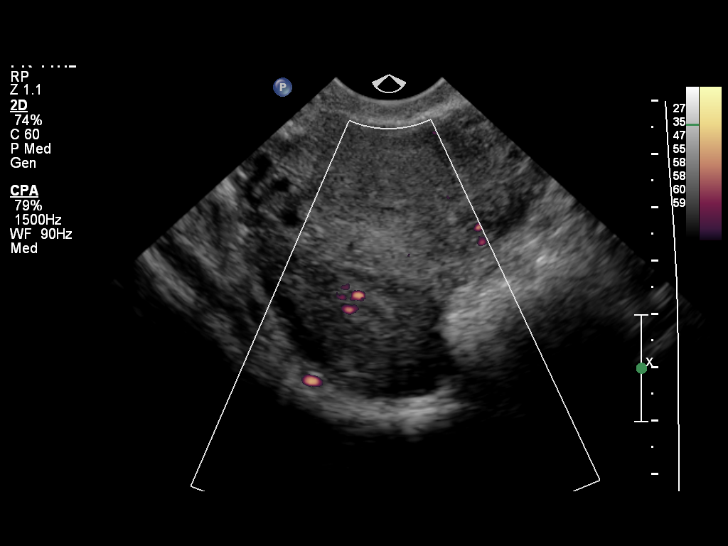
[im 24/26]
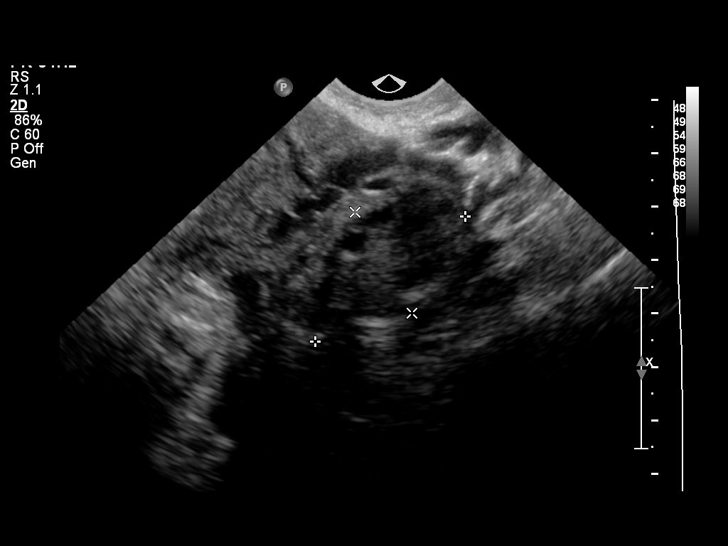
[im 26/26]
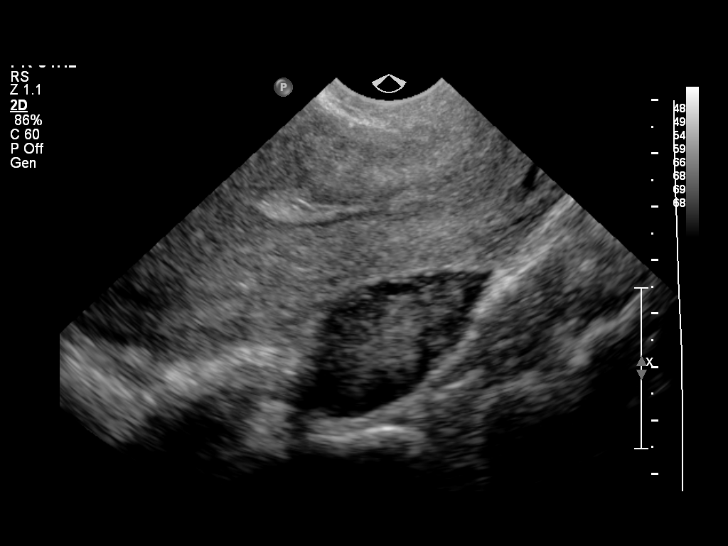

[14 of 26 positions shown; findings below may reference images not displayed]

FINDINGS: Intrauterine gestational sac: Visualized/normal in shape.

Yolk sac:  Visualized

Embryo:  Visualized

Cardiac Activity: Visualized

Heart Rate: 132 bpm

MSD:   mm    w     d

CRL:   5.6  mm   6 w 3 d                  US EDC: 08/13/2014

Maternal uterus/adnexae: No subchorionic hemorrhage. No adnexal
masses or free fluid.
IMPRESSION: Six week 3 day intrauterine pregnancy. Fetal heart rate 132 beats
per min. No acute maternal findings.

## 2019-01-19 ENCOUNTER — Ambulatory Visit: Payer: Medicaid Other | Attending: Family Medicine

## 2019-01-19 ENCOUNTER — Other Ambulatory Visit: Payer: Self-pay

## 2019-01-19 DIAGNOSIS — M25612 Stiffness of left shoulder, not elsewhere classified: Secondary | ICD-10-CM

## 2019-01-19 DIAGNOSIS — M25512 Pain in left shoulder: Secondary | ICD-10-CM | POA: Diagnosis present

## 2019-01-19 DIAGNOSIS — M25511 Pain in right shoulder: Secondary | ICD-10-CM | POA: Diagnosis present

## 2019-01-19 DIAGNOSIS — M25611 Stiffness of right shoulder, not elsewhere classified: Secondary | ICD-10-CM

## 2019-01-19 DIAGNOSIS — I972 Postmastectomy lymphedema syndrome: Secondary | ICD-10-CM | POA: Diagnosis present
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
## 2019-01-26 ENCOUNTER — Other Ambulatory Visit: Payer: Self-pay

## 2019-01-26 ENCOUNTER — Ambulatory Visit: Payer: Medicaid Other

## 2019-01-26 DIAGNOSIS — M25512 Pain in left shoulder: Secondary | ICD-10-CM

## 2019-01-26 DIAGNOSIS — M25511 Pain in right shoulder: Secondary | ICD-10-CM

## 2019-01-26 DIAGNOSIS — M25611 Stiffness of right shoulder, not elsewhere classified: Secondary | ICD-10-CM

## 2019-01-26 DIAGNOSIS — I972 Postmastectomy lymphedema syndrome: Secondary | ICD-10-CM

## 2019-01-26 DIAGNOSIS — M25612 Stiffness of left shoulder, not elsewhere classified: Secondary | ICD-10-CM

## 2019-01-26 NOTE — Patient Instructions (Signed)
SHOULDER: Flexion - Supine (Cane)        Cancer Rehab [phone]    Hold cane in both hands. Raise arms up overhead. Do not allow back to arch. Hold _5__ seconds. Do __5-10__ times; __1-2__ times a day.   SELF ASSISTED WITH OBJECT: Shoulder Abduction / Adduction - Supine    Hold cane with both hands. Move both arms from side to side, keep elbows straight.  Hold when stretch felt for __5__ seconds. Repeat __5-10__ times; __1-2__ times a day. Once this becomes easier progress to third picture bringing affected arm towards ear by staying out to side. Same hold for _5_seconds. Repeat  _5-10_ times, _1-2_ times/day.  Shoulder Blade Stretch    Clasp fingers behind head with elbows touching in front of face. Pull elbows back while pressing shoulder blades together. Relax and hold as tolerated, can place pillow under elbow here for comfort as needed and to allow for prolonged stretch.  Repeat __5__ times. Do __1-2__ sessions per day.     SHOULDER: External Rotation - Supine (Cane)    Hold cane with both hands. Rotate arm away from body. Keep elbow on floor and next to body. _5-10__ reps per set, hold 5 seconds, _1-2__ sets per day. Add towel to keep elbow at side.
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
## 2019-02-02 ENCOUNTER — Ambulatory Visit: Payer: Medicaid Other

## 2019-02-02 ENCOUNTER — Other Ambulatory Visit: Payer: Self-pay

## 2019-02-02 DIAGNOSIS — M25512 Pain in left shoulder: Secondary | ICD-10-CM

## 2019-02-02 DIAGNOSIS — M25612 Stiffness of left shoulder, not elsewhere classified: Secondary | ICD-10-CM

## 2019-02-02 DIAGNOSIS — M25511 Pain in right shoulder: Secondary | ICD-10-CM

## 2019-02-02 DIAGNOSIS — I972 Postmastectomy lymphedema syndrome: Secondary | ICD-10-CM

## 2019-02-02 DIAGNOSIS — M25611 Stiffness of right shoulder, not elsewhere classified: Secondary | ICD-10-CM
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
## 2019-02-09 ENCOUNTER — Other Ambulatory Visit: Payer: Self-pay

## 2019-02-09 ENCOUNTER — Ambulatory Visit: Payer: Medicaid Other | Attending: Pain Medicine

## 2019-02-09 DIAGNOSIS — M25612 Stiffness of left shoulder, not elsewhere classified: Secondary | ICD-10-CM | POA: Diagnosis present

## 2019-02-09 DIAGNOSIS — I972 Postmastectomy lymphedema syndrome: Secondary | ICD-10-CM | POA: Insufficient documentation

## 2019-02-09 DIAGNOSIS — M25512 Pain in left shoulder: Secondary | ICD-10-CM | POA: Diagnosis present

## 2019-02-09 DIAGNOSIS — M25611 Stiffness of right shoulder, not elsewhere classified: Secondary | ICD-10-CM | POA: Diagnosis present

## 2019-02-09 DIAGNOSIS — M25511 Pain in right shoulder: Secondary | ICD-10-CM | POA: Diagnosis present
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
## 2019-02-15 ENCOUNTER — Other Ambulatory Visit: Payer: Self-pay

## 2019-02-15 ENCOUNTER — Emergency Department (HOSPITAL_COMMUNITY)
Admission: EM | Admit: 2019-02-15 | Discharge: 2019-02-15 | Disposition: A | Discharge status: Home / Self Care | Payer: Medicaid Other | Attending: Emergency Medicine | Admitting: Emergency Medicine

## 2019-02-15 ENCOUNTER — Encounter (HOSPITAL_COMMUNITY): Payer: Self-pay

## 2019-02-15 DIAGNOSIS — Z79899 Other long term (current) drug therapy: Secondary | ICD-10-CM | POA: Insufficient documentation

## 2019-02-15 DIAGNOSIS — Y93G9 Activity, other involving cooking and grilling: Secondary | ICD-10-CM | POA: Insufficient documentation

## 2019-02-15 DIAGNOSIS — T24232A Burn of second degree of left lower leg, initial encounter: Secondary | ICD-10-CM | POA: Diagnosis not present

## 2019-02-15 DIAGNOSIS — T300 Burn of unspecified body region, unspecified degree: Secondary | ICD-10-CM

## 2019-02-15 DIAGNOSIS — T310 Burns involving less than 10% of body surface: Secondary | ICD-10-CM | POA: Insufficient documentation

## 2019-02-15 DIAGNOSIS — X102XXA Contact with fats and cooking oils, initial encounter: Secondary | ICD-10-CM | POA: Diagnosis not present

## 2019-02-15 DIAGNOSIS — Y92000 Kitchen of unspecified non-institutional (private) residence as  the place of occurrence of the external cause: Secondary | ICD-10-CM | POA: Diagnosis not present

## 2019-02-15 DIAGNOSIS — Y999 Unspecified external cause status: Secondary | ICD-10-CM | POA: Insufficient documentation

## 2019-02-15 DIAGNOSIS — T2101XA Burn of unspecified degree of chest wall, initial encounter: Secondary | ICD-10-CM | POA: Diagnosis present

## 2019-02-15 MED ORDER — HYDROMORPHONE HCL 1 MG/ML IJ SOLN
1.0000 mg | Freq: Once | INTRAMUSCULAR | Status: AC
  Administered 2019-02-15: 1 mg via INTRAVENOUS
  Filled 2019-02-15: qty 1

## 2019-02-15 MED ORDER — HYDROMORPHONE HCL 2 MG PO TABS: 2.0000 mg | ORAL_TABLET | ORAL | 0 refills | Status: AC | PRN

## 2019-02-15 MED ORDER — SILVER SULFADIAZINE 1 % EX CREA
TOPICAL_CREAM | Freq: Once | CUTANEOUS | Status: AC
  Filled 2019-02-15: qty 85

## 2019-02-15 MED ORDER — SILVER SULFADIAZINE 1 % EX CREA: 1.0000 "application " | TOPICAL_CREAM | Freq: Every day | CUTANEOUS | 0 refills | Status: AC
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
## 2019-02-15 NOTE — Discharge Instructions (Addendum)
You were seen in the emergency department for burn.  Please follow-up with [facility] burn clinic, phone number is [phone], please call tomorrow morning to make an appointment. Use SSD for your burn once daily, take pain medication as prescribed. Return to ED for worsening fevers, worsening burn, bleeding or discharge from the area or pain that is intolerable.

## 2019-02-16 ENCOUNTER — Other Ambulatory Visit: Payer: Self-pay

## 2019-02-16 ENCOUNTER — Ambulatory Visit: Payer: Medicaid Other

## 2019-02-16 DIAGNOSIS — M25511 Pain in right shoulder: Secondary | ICD-10-CM

## 2019-02-16 DIAGNOSIS — M25612 Stiffness of left shoulder, not elsewhere classified: Secondary | ICD-10-CM

## 2019-02-16 DIAGNOSIS — I972 Postmastectomy lymphedema syndrome: Secondary | ICD-10-CM

## 2019-02-16 DIAGNOSIS — M25611 Stiffness of right shoulder, not elsewhere classified: Secondary | ICD-10-CM

## 2019-02-16 DIAGNOSIS — M25512 Pain in left shoulder: Secondary | ICD-10-CM
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
## 2019-02-18 ENCOUNTER — Ambulatory Visit: Payer: Medicaid Other

## 2019-02-18 ENCOUNTER — Other Ambulatory Visit: Payer: Self-pay

## 2019-02-18 DIAGNOSIS — M25612 Stiffness of left shoulder, not elsewhere classified: Secondary | ICD-10-CM

## 2019-02-18 DIAGNOSIS — M25512 Pain in left shoulder: Secondary | ICD-10-CM

## 2019-02-18 DIAGNOSIS — M25511 Pain in right shoulder: Secondary | ICD-10-CM

## 2019-02-18 DIAGNOSIS — I972 Postmastectomy lymphedema syndrome: Secondary | ICD-10-CM

## 2019-02-18 DIAGNOSIS — M25611 Stiffness of right shoulder, not elsewhere classified: Secondary | ICD-10-CM
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
## 2019-03-02 ENCOUNTER — Ambulatory Visit: Payer: Medicaid Other

## 2019-03-02 ENCOUNTER — Other Ambulatory Visit: Payer: Self-pay

## 2019-03-02 DIAGNOSIS — M25612 Stiffness of left shoulder, not elsewhere classified: Secondary | ICD-10-CM

## 2019-03-02 DIAGNOSIS — I972 Postmastectomy lymphedema syndrome: Secondary | ICD-10-CM

## 2019-03-02 DIAGNOSIS — M25611 Stiffness of right shoulder, not elsewhere classified: Secondary | ICD-10-CM

## 2019-03-02 DIAGNOSIS — M25512 Pain in left shoulder: Secondary | ICD-10-CM

## 2019-03-02 DIAGNOSIS — M25511 Pain in right shoulder: Secondary | ICD-10-CM
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
## 2019-03-04 ENCOUNTER — Telehealth: Payer: Self-pay

## 2019-03-04 NOTE — Telephone Encounter (Signed)
Pt had tried to cancel appointment but it was not canceled. Called patient and she stated she had tried to cancel her appointment but it did not cancel. She is aware of next appointment and will be at that appointment.   Tomma Rakers DPT

## 2019-03-09 ENCOUNTER — Other Ambulatory Visit: Payer: Self-pay

## 2019-03-09 ENCOUNTER — Ambulatory Visit: Payer: Medicaid Other | Attending: Pain Medicine

## 2019-03-09 DIAGNOSIS — M25612 Stiffness of left shoulder, not elsewhere classified: Secondary | ICD-10-CM | POA: Diagnosis present

## 2019-03-09 DIAGNOSIS — I972 Postmastectomy lymphedema syndrome: Secondary | ICD-10-CM | POA: Insufficient documentation

## 2019-03-09 DIAGNOSIS — M25511 Pain in right shoulder: Secondary | ICD-10-CM | POA: Diagnosis present

## 2019-03-09 DIAGNOSIS — M25611 Stiffness of right shoulder, not elsewhere classified: Secondary | ICD-10-CM | POA: Insufficient documentation

## 2019-03-09 DIAGNOSIS — M25512 Pain in left shoulder: Secondary | ICD-10-CM | POA: Diagnosis present
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
## 2019-03-11 ENCOUNTER — Ambulatory Visit: Payer: Medicaid Other

## 2019-03-13 ENCOUNTER — Ambulatory Visit: Payer: Medicaid Other

## 2019-03-16 ENCOUNTER — Other Ambulatory Visit: Payer: Self-pay

## 2019-03-16 ENCOUNTER — Ambulatory Visit: Payer: Medicaid Other

## 2019-03-16 DIAGNOSIS — I972 Postmastectomy lymphedema syndrome: Secondary | ICD-10-CM | POA: Diagnosis not present

## 2019-03-16 DIAGNOSIS — M25511 Pain in right shoulder: Secondary | ICD-10-CM

## 2019-03-16 DIAGNOSIS — M25611 Stiffness of right shoulder, not elsewhere classified: Secondary | ICD-10-CM

## 2019-03-16 DIAGNOSIS — M25612 Stiffness of left shoulder, not elsewhere classified: Secondary | ICD-10-CM

## 2019-03-16 DIAGNOSIS — M25512 Pain in left shoulder: Secondary | ICD-10-CM
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
## 2019-03-18 ENCOUNTER — Other Ambulatory Visit: Payer: Self-pay

## 2019-03-18 ENCOUNTER — Ambulatory Visit: Payer: Medicaid Other

## 2019-03-18 DIAGNOSIS — M25511 Pain in right shoulder: Secondary | ICD-10-CM

## 2019-03-18 DIAGNOSIS — M25611 Stiffness of right shoulder, not elsewhere classified: Secondary | ICD-10-CM

## 2019-03-18 DIAGNOSIS — I972 Postmastectomy lymphedema syndrome: Secondary | ICD-10-CM

## 2019-03-18 DIAGNOSIS — M25512 Pain in left shoulder: Secondary | ICD-10-CM

## 2019-03-18 DIAGNOSIS — M25612 Stiffness of left shoulder, not elsewhere classified: Secondary | ICD-10-CM
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
## 2019-03-23 ENCOUNTER — Ambulatory Visit: Payer: Medicaid Other

## 2019-03-23 ENCOUNTER — Other Ambulatory Visit: Payer: Self-pay

## 2019-03-23 DIAGNOSIS — M25512 Pain in left shoulder: Secondary | ICD-10-CM

## 2019-03-23 DIAGNOSIS — M25612 Stiffness of left shoulder, not elsewhere classified: Secondary | ICD-10-CM

## 2019-03-23 DIAGNOSIS — I972 Postmastectomy lymphedema syndrome: Secondary | ICD-10-CM

## 2019-03-23 DIAGNOSIS — M25511 Pain in right shoulder: Secondary | ICD-10-CM

## 2019-03-23 DIAGNOSIS — M25611 Stiffness of right shoulder, not elsewhere classified: Secondary | ICD-10-CM
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
## 2019-03-25 ENCOUNTER — Ambulatory Visit: Payer: Medicaid Other

## 2019-03-30 ENCOUNTER — Ambulatory Visit: Payer: Medicaid Other

## 2019-03-30 ENCOUNTER — Other Ambulatory Visit: Payer: Self-pay

## 2019-03-30 DIAGNOSIS — M25511 Pain in right shoulder: Secondary | ICD-10-CM

## 2019-03-30 DIAGNOSIS — I972 Postmastectomy lymphedema syndrome: Secondary | ICD-10-CM | POA: Diagnosis not present

## 2019-03-30 DIAGNOSIS — M25611 Stiffness of right shoulder, not elsewhere classified: Secondary | ICD-10-CM

## 2019-03-30 DIAGNOSIS — M25612 Stiffness of left shoulder, not elsewhere classified: Secondary | ICD-10-CM

## 2019-03-30 DIAGNOSIS — M25512 Pain in left shoulder: Secondary | ICD-10-CM
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
## 2019-03-30 NOTE — Patient Instructions (Signed)
Access Code: [email or web address] URL: [email or web address] Date: 03/30/2019 Prepared by: Tomma Rakers  Exercises Lovena Neighbours - 1 x daily - 7 x weekly - 1 sets - 10 reps Sidelying Open Book Thoracic Lumbar Rotation and Extension - 1 x daily - 7 x weekly - 1 sets - 10 reps Sidelying Shoulder Abduction Palm Forward - 1 x daily - 7 x weekly - 1 sets - 10 reps Supine Shoulder Flexion AAROM with Hands Clasped - 1 x daily - 7 x weekly - 1 sets - 10 reps - 5 second with stretching try to avoid significant increase in pain. hold

## 2019-05-01 ENCOUNTER — Ambulatory Visit: Payer: Medicaid Other

## 2019-05-04 ENCOUNTER — Other Ambulatory Visit: Payer: Self-pay

## 2019-05-04 ENCOUNTER — Ambulatory Visit: Payer: Medicaid Other | Attending: Pain Medicine

## 2019-05-04 DIAGNOSIS — M25512 Pain in left shoulder: Secondary | ICD-10-CM | POA: Insufficient documentation

## 2019-05-04 DIAGNOSIS — M25612 Stiffness of left shoulder, not elsewhere classified: Secondary | ICD-10-CM | POA: Insufficient documentation

## 2019-05-04 DIAGNOSIS — I972 Postmastectomy lymphedema syndrome: Secondary | ICD-10-CM | POA: Insufficient documentation

## 2019-05-04 DIAGNOSIS — M25611 Stiffness of right shoulder, not elsewhere classified: Secondary | ICD-10-CM | POA: Diagnosis present

## 2019-05-04 DIAGNOSIS — M25511 Pain in right shoulder: Secondary | ICD-10-CM
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
## 2019-05-04 NOTE — Patient Instructions (Signed)

## 2019-05-06 ENCOUNTER — Ambulatory Visit: Payer: Medicaid Other

## 2019-05-06 ENCOUNTER — Other Ambulatory Visit: Payer: Self-pay

## 2019-05-06 DIAGNOSIS — I972 Postmastectomy lymphedema syndrome: Secondary | ICD-10-CM | POA: Diagnosis not present

## 2019-05-06 DIAGNOSIS — M25612 Stiffness of left shoulder, not elsewhere classified: Secondary | ICD-10-CM

## 2019-05-06 DIAGNOSIS — M25611 Stiffness of right shoulder, not elsewhere classified: Secondary | ICD-10-CM

## 2019-05-06 DIAGNOSIS — M25511 Pain in right shoulder: Secondary | ICD-10-CM

## 2019-05-06 DIAGNOSIS — M25512 Pain in left shoulder: Secondary | ICD-10-CM
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
## 2019-05-11 ENCOUNTER — Ambulatory Visit: Payer: Medicaid Other | Attending: Pain Medicine

## 2019-05-11 ENCOUNTER — Other Ambulatory Visit: Payer: Self-pay

## 2019-05-11 DIAGNOSIS — M25612 Stiffness of left shoulder, not elsewhere classified: Secondary | ICD-10-CM | POA: Insufficient documentation

## 2019-05-11 DIAGNOSIS — I972 Postmastectomy lymphedema syndrome: Secondary | ICD-10-CM | POA: Diagnosis not present

## 2019-05-11 DIAGNOSIS — M25611 Stiffness of right shoulder, not elsewhere classified: Secondary | ICD-10-CM | POA: Insufficient documentation

## 2019-05-11 DIAGNOSIS — M25512 Pain in left shoulder: Secondary | ICD-10-CM | POA: Insufficient documentation

## 2019-05-11 DIAGNOSIS — M25511 Pain in right shoulder: Secondary | ICD-10-CM | POA: Insufficient documentation
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
## 2019-05-13 ENCOUNTER — Ambulatory Visit: Payer: Medicaid Other

## 2019-05-18 ENCOUNTER — Ambulatory Visit: Payer: Medicaid Other

## 2019-05-18 ENCOUNTER — Other Ambulatory Visit: Payer: Self-pay

## 2019-05-18 DIAGNOSIS — M25611 Stiffness of right shoulder, not elsewhere classified: Secondary | ICD-10-CM

## 2019-05-18 DIAGNOSIS — M25511 Pain in right shoulder: Secondary | ICD-10-CM

## 2019-05-18 DIAGNOSIS — I972 Postmastectomy lymphedema syndrome: Secondary | ICD-10-CM | POA: Diagnosis not present

## 2019-05-18 DIAGNOSIS — M25612 Stiffness of left shoulder, not elsewhere classified: Secondary | ICD-10-CM

## 2019-05-18 DIAGNOSIS — M25512 Pain in left shoulder: Secondary | ICD-10-CM
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
## 2019-06-05 ENCOUNTER — Ambulatory Visit: Payer: Medicaid Other

## 2019-06-17 ENCOUNTER — Other Ambulatory Visit: Payer: Self-pay

## 2019-06-17 ENCOUNTER — Ambulatory Visit: Payer: Medicaid Other | Attending: Pain Medicine

## 2019-06-17 DIAGNOSIS — I972 Postmastectomy lymphedema syndrome: Secondary | ICD-10-CM | POA: Insufficient documentation

## 2019-06-17 DIAGNOSIS — M25511 Pain in right shoulder: Secondary | ICD-10-CM | POA: Diagnosis present

## 2019-06-17 DIAGNOSIS — M25512 Pain in left shoulder: Secondary | ICD-10-CM | POA: Insufficient documentation

## 2019-06-17 DIAGNOSIS — M25612 Stiffness of left shoulder, not elsewhere classified: Secondary | ICD-10-CM | POA: Insufficient documentation

## 2019-06-17 DIAGNOSIS — M25611 Stiffness of right shoulder, not elsewhere classified: Secondary | ICD-10-CM | POA: Insufficient documentation
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
## 2019-06-19 ENCOUNTER — Ambulatory Visit: Payer: Medicaid Other

## 2019-06-19 ENCOUNTER — Other Ambulatory Visit: Payer: Self-pay

## 2019-06-19 DIAGNOSIS — M25512 Pain in left shoulder: Secondary | ICD-10-CM

## 2019-06-19 DIAGNOSIS — M25511 Pain in right shoulder: Secondary | ICD-10-CM

## 2019-06-19 DIAGNOSIS — M25612 Stiffness of left shoulder, not elsewhere classified: Secondary | ICD-10-CM

## 2019-06-19 DIAGNOSIS — I972 Postmastectomy lymphedema syndrome: Secondary | ICD-10-CM

## 2019-06-19 DIAGNOSIS — M25611 Stiffness of right shoulder, not elsewhere classified: Secondary | ICD-10-CM
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
## 2019-06-22 ENCOUNTER — Ambulatory Visit: Payer: Medicaid Other

## 2019-06-23 ENCOUNTER — Other Ambulatory Visit: Payer: Self-pay

## 2019-06-23 ENCOUNTER — Ambulatory Visit: Payer: Medicaid Other

## 2019-06-23 DIAGNOSIS — M25512 Pain in left shoulder: Secondary | ICD-10-CM

## 2019-06-23 DIAGNOSIS — M25611 Stiffness of right shoulder, not elsewhere classified: Secondary | ICD-10-CM

## 2019-06-23 DIAGNOSIS — I972 Postmastectomy lymphedema syndrome: Secondary | ICD-10-CM | POA: Diagnosis not present

## 2019-06-23 DIAGNOSIS — M25612 Stiffness of left shoulder, not elsewhere classified: Secondary | ICD-10-CM

## 2019-06-23 DIAGNOSIS — M25511 Pain in right shoulder: Secondary | ICD-10-CM
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
## 2019-06-26 ENCOUNTER — Ambulatory Visit: Payer: Medicaid Other

## 2019-06-29 ENCOUNTER — Ambulatory Visit: Payer: Medicaid Other

## 2019-07-02 ENCOUNTER — Other Ambulatory Visit: Payer: Self-pay

## 2019-07-02 ENCOUNTER — Ambulatory Visit: Payer: Medicaid Other

## 2019-07-02 DIAGNOSIS — M25512 Pain in left shoulder: Secondary | ICD-10-CM

## 2019-07-02 DIAGNOSIS — M25611 Stiffness of right shoulder, not elsewhere classified: Secondary | ICD-10-CM

## 2019-07-02 DIAGNOSIS — M25511 Pain in right shoulder: Secondary | ICD-10-CM

## 2019-07-02 DIAGNOSIS — I972 Postmastectomy lymphedema syndrome: Secondary | ICD-10-CM | POA: Diagnosis not present

## 2019-07-02 DIAGNOSIS — M25612 Stiffness of left shoulder, not elsewhere classified: Secondary | ICD-10-CM
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
## 2019-07-08 ENCOUNTER — Ambulatory Visit: Payer: Medicaid Other

## 2019-07-08 ENCOUNTER — Other Ambulatory Visit: Payer: Self-pay

## 2019-07-08 DIAGNOSIS — M25512 Pain in left shoulder: Secondary | ICD-10-CM

## 2019-07-08 DIAGNOSIS — M25511 Pain in right shoulder: Secondary | ICD-10-CM

## 2019-07-08 DIAGNOSIS — I972 Postmastectomy lymphedema syndrome: Secondary | ICD-10-CM

## 2019-07-08 DIAGNOSIS — M25611 Stiffness of right shoulder, not elsewhere classified: Secondary | ICD-10-CM

## 2019-07-08 DIAGNOSIS — M25612 Stiffness of left shoulder, not elsewhere classified: Secondary | ICD-10-CM
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
## 2019-07-31 ENCOUNTER — Encounter: Payer: Self-pay | Admitting: Rehabilitation

## 2019-07-31 ENCOUNTER — Other Ambulatory Visit: Payer: Self-pay

## 2019-07-31 ENCOUNTER — Ambulatory Visit: Payer: Medicaid Other | Attending: Pain Medicine | Admitting: Rehabilitation

## 2019-07-31 DIAGNOSIS — M25512 Pain in left shoulder: Secondary | ICD-10-CM | POA: Insufficient documentation

## 2019-07-31 DIAGNOSIS — M25511 Pain in right shoulder: Secondary | ICD-10-CM | POA: Diagnosis present

## 2019-07-31 DIAGNOSIS — M25611 Stiffness of right shoulder, not elsewhere classified: Secondary | ICD-10-CM | POA: Insufficient documentation

## 2019-07-31 DIAGNOSIS — I972 Postmastectomy lymphedema syndrome: Secondary | ICD-10-CM | POA: Diagnosis not present

## 2019-07-31 DIAGNOSIS — M25612 Stiffness of left shoulder, not elsewhere classified: Secondary | ICD-10-CM | POA: Insufficient documentation
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
## 2019-08-14 ENCOUNTER — Other Ambulatory Visit: Payer: Self-pay

## 2019-08-14 ENCOUNTER — Ambulatory Visit: Payer: Medicaid Other | Attending: Pain Medicine | Admitting: Rehabilitation

## 2019-08-14 ENCOUNTER — Encounter: Payer: Self-pay | Admitting: Rehabilitation

## 2019-08-14 DIAGNOSIS — I972 Postmastectomy lymphedema syndrome: Secondary | ICD-10-CM

## 2019-08-14 DIAGNOSIS — M25612 Stiffness of left shoulder, not elsewhere classified: Secondary | ICD-10-CM | POA: Diagnosis present

## 2019-08-14 DIAGNOSIS — M25511 Pain in right shoulder: Secondary | ICD-10-CM

## 2019-08-14 DIAGNOSIS — M25512 Pain in left shoulder: Secondary | ICD-10-CM | POA: Diagnosis present

## 2019-08-14 DIAGNOSIS — M25611 Stiffness of right shoulder, not elsewhere classified: Secondary | ICD-10-CM | POA: Diagnosis present
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
## 2019-08-21 ENCOUNTER — Encounter: Payer: Self-pay | Admitting: Rehabilitation

## 2019-08-28 ENCOUNTER — Other Ambulatory Visit: Payer: Self-pay

## 2019-08-28 ENCOUNTER — Ambulatory Visit: Payer: Medicaid Other | Admitting: Rehabilitation

## 2019-08-28 ENCOUNTER — Encounter: Payer: Self-pay | Admitting: Rehabilitation

## 2019-08-28 DIAGNOSIS — M25611 Stiffness of right shoulder, not elsewhere classified: Secondary | ICD-10-CM

## 2019-08-28 DIAGNOSIS — M25512 Pain in left shoulder: Secondary | ICD-10-CM

## 2019-08-28 DIAGNOSIS — I972 Postmastectomy lymphedema syndrome: Secondary | ICD-10-CM

## 2019-08-28 DIAGNOSIS — M25612 Stiffness of left shoulder, not elsewhere classified: Secondary | ICD-10-CM

## 2019-08-28 DIAGNOSIS — M25511 Pain in right shoulder: Secondary | ICD-10-CM
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
## 2019-09-11 ENCOUNTER — Ambulatory Visit: Payer: Medicaid Other | Admitting: Rehabilitation

## 2019-09-25 ENCOUNTER — Ambulatory Visit: Payer: Medicaid Other | Attending: Pain Medicine | Admitting: Rehabilitation

## 2019-09-25 ENCOUNTER — Other Ambulatory Visit: Payer: Self-pay

## 2019-09-25 ENCOUNTER — Encounter: Payer: Self-pay | Admitting: Rehabilitation

## 2019-09-25 DIAGNOSIS — M25511 Pain in right shoulder: Secondary | ICD-10-CM | POA: Diagnosis present

## 2019-09-25 DIAGNOSIS — M25512 Pain in left shoulder: Secondary | ICD-10-CM | POA: Diagnosis present

## 2019-09-25 DIAGNOSIS — I972 Postmastectomy lymphedema syndrome: Secondary | ICD-10-CM | POA: Insufficient documentation

## 2019-09-25 DIAGNOSIS — M25611 Stiffness of right shoulder, not elsewhere classified: Secondary | ICD-10-CM | POA: Diagnosis present

## 2019-09-25 DIAGNOSIS — M25612 Stiffness of left shoulder, not elsewhere classified: Secondary | ICD-10-CM | POA: Diagnosis present
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
## 2019-10-02 ENCOUNTER — Other Ambulatory Visit: Payer: Self-pay

## 2019-10-02 ENCOUNTER — Encounter: Payer: Self-pay | Admitting: Rehabilitation

## 2019-10-02 ENCOUNTER — Ambulatory Visit: Payer: Medicaid Other | Admitting: Rehabilitation

## 2019-10-02 DIAGNOSIS — M25611 Stiffness of right shoulder, not elsewhere classified: Secondary | ICD-10-CM

## 2019-10-02 DIAGNOSIS — M25612 Stiffness of left shoulder, not elsewhere classified: Secondary | ICD-10-CM

## 2019-10-02 DIAGNOSIS — I972 Postmastectomy lymphedema syndrome: Secondary | ICD-10-CM

## 2019-10-02 DIAGNOSIS — M25512 Pain in left shoulder: Secondary | ICD-10-CM

## 2019-10-02 DIAGNOSIS — M25511 Pain in right shoulder: Secondary | ICD-10-CM
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
## 2019-10-09 ENCOUNTER — Ambulatory Visit: Payer: Medicaid Other | Attending: Pain Medicine | Admitting: Rehabilitation

## 2019-10-09 ENCOUNTER — Other Ambulatory Visit: Payer: Self-pay

## 2019-10-09 ENCOUNTER — Encounter: Payer: Self-pay | Admitting: Rehabilitation

## 2019-10-09 DIAGNOSIS — M25512 Pain in left shoulder: Secondary | ICD-10-CM | POA: Diagnosis present

## 2019-10-09 DIAGNOSIS — M25611 Stiffness of right shoulder, not elsewhere classified: Secondary | ICD-10-CM | POA: Insufficient documentation

## 2019-10-09 DIAGNOSIS — I972 Postmastectomy lymphedema syndrome: Secondary | ICD-10-CM | POA: Diagnosis not present

## 2019-10-09 DIAGNOSIS — M25612 Stiffness of left shoulder, not elsewhere classified: Secondary | ICD-10-CM | POA: Insufficient documentation

## 2019-10-09 DIAGNOSIS — M25511 Pain in right shoulder: Secondary | ICD-10-CM | POA: Insufficient documentation
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
## 2019-10-16 ENCOUNTER — Encounter: Payer: Self-pay | Admitting: Rehabilitation

## 2019-10-16 ENCOUNTER — Other Ambulatory Visit: Payer: Self-pay

## 2019-10-16 ENCOUNTER — Ambulatory Visit: Payer: Medicaid Other | Admitting: Rehabilitation

## 2019-10-16 DIAGNOSIS — M25512 Pain in left shoulder: Secondary | ICD-10-CM

## 2019-10-16 DIAGNOSIS — I972 Postmastectomy lymphedema syndrome: Secondary | ICD-10-CM

## 2019-10-16 DIAGNOSIS — M25611 Stiffness of right shoulder, not elsewhere classified: Secondary | ICD-10-CM

## 2019-10-16 DIAGNOSIS — M25612 Stiffness of left shoulder, not elsewhere classified: Secondary | ICD-10-CM

## 2019-10-16 DIAGNOSIS — M25511 Pain in right shoulder: Secondary | ICD-10-CM
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
## 2019-10-23 ENCOUNTER — Encounter: Payer: Self-pay | Admitting: Physical Therapy

## 2019-10-23 ENCOUNTER — Other Ambulatory Visit: Payer: Self-pay

## 2019-10-23 ENCOUNTER — Ambulatory Visit: Payer: Medicaid Other | Admitting: Physical Therapy

## 2019-10-23 DIAGNOSIS — M25612 Stiffness of left shoulder, not elsewhere classified: Secondary | ICD-10-CM

## 2019-10-23 DIAGNOSIS — M25511 Pain in right shoulder: Secondary | ICD-10-CM

## 2019-10-23 DIAGNOSIS — M25611 Stiffness of right shoulder, not elsewhere classified: Secondary | ICD-10-CM

## 2019-10-23 DIAGNOSIS — I972 Postmastectomy lymphedema syndrome: Secondary | ICD-10-CM | POA: Diagnosis not present

## 2019-10-23 DIAGNOSIS — M25512 Pain in left shoulder: Secondary | ICD-10-CM
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
## 2019-10-30 ENCOUNTER — Ambulatory Visit: Payer: Medicaid Other | Admitting: Rehabilitation

## 2019-11-06 ENCOUNTER — Other Ambulatory Visit: Payer: Self-pay

## 2019-11-06 ENCOUNTER — Ambulatory Visit: Payer: Medicaid Other | Admitting: Rehabilitation

## 2019-11-06 ENCOUNTER — Encounter: Payer: Self-pay | Admitting: Rehabilitation

## 2019-11-06 DIAGNOSIS — M25512 Pain in left shoulder: Secondary | ICD-10-CM

## 2019-11-06 DIAGNOSIS — M25511 Pain in right shoulder: Secondary | ICD-10-CM

## 2019-11-06 DIAGNOSIS — M25612 Stiffness of left shoulder, not elsewhere classified: Secondary | ICD-10-CM

## 2019-11-06 DIAGNOSIS — M25611 Stiffness of right shoulder, not elsewhere classified: Secondary | ICD-10-CM

## 2019-11-06 DIAGNOSIS — I972 Postmastectomy lymphedema syndrome: Secondary | ICD-10-CM | POA: Diagnosis not present
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
## 2019-11-10 NOTE — Addendum Note (Signed)
Addended by: Shan Levans R on: 11/10/2019 01:59 PM   Modules accepted: Orders

## 2019-11-13 ENCOUNTER — Ambulatory Visit: Payer: Medicaid Other | Attending: Pain Medicine | Admitting: Rehabilitation

## 2019-11-13 DIAGNOSIS — M25512 Pain in left shoulder: Secondary | ICD-10-CM | POA: Insufficient documentation

## 2019-11-13 DIAGNOSIS — M25612 Stiffness of left shoulder, not elsewhere classified: Secondary | ICD-10-CM | POA: Insufficient documentation

## 2019-11-13 DIAGNOSIS — I972 Postmastectomy lymphedema syndrome: Secondary | ICD-10-CM | POA: Insufficient documentation

## 2019-11-13 DIAGNOSIS — M25611 Stiffness of right shoulder, not elsewhere classified: Secondary | ICD-10-CM | POA: Insufficient documentation

## 2019-11-13 DIAGNOSIS — M25511 Pain in right shoulder: Secondary | ICD-10-CM | POA: Insufficient documentation

## 2019-11-20 ENCOUNTER — Encounter: Payer: Self-pay | Admitting: Physical Therapy

## 2019-11-20 ENCOUNTER — Ambulatory Visit: Payer: Medicaid Other | Admitting: Physical Therapy

## 2019-11-20 ENCOUNTER — Other Ambulatory Visit: Payer: Self-pay

## 2019-11-20 DIAGNOSIS — M25611 Stiffness of right shoulder, not elsewhere classified: Secondary | ICD-10-CM | POA: Diagnosis present

## 2019-11-20 DIAGNOSIS — M25511 Pain in right shoulder: Secondary | ICD-10-CM

## 2019-11-20 DIAGNOSIS — M25512 Pain in left shoulder: Secondary | ICD-10-CM

## 2019-11-20 DIAGNOSIS — I972 Postmastectomy lymphedema syndrome: Secondary | ICD-10-CM

## 2019-11-20 DIAGNOSIS — M25612 Stiffness of left shoulder, not elsewhere classified: Secondary | ICD-10-CM
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
## 2019-11-23 ENCOUNTER — Ambulatory Visit: Payer: Medicaid Other | Admitting: Physical Therapy

## 2019-11-27 ENCOUNTER — Encounter: Payer: Self-pay | Admitting: Rehabilitation

## 2019-11-27 ENCOUNTER — Ambulatory Visit: Payer: Medicaid Other | Admitting: Rehabilitation

## 2019-11-27 ENCOUNTER — Other Ambulatory Visit: Payer: Self-pay

## 2019-11-27 DIAGNOSIS — M25511 Pain in right shoulder: Secondary | ICD-10-CM

## 2019-11-27 DIAGNOSIS — I972 Postmastectomy lymphedema syndrome: Secondary | ICD-10-CM

## 2019-11-27 DIAGNOSIS — M25512 Pain in left shoulder: Secondary | ICD-10-CM | POA: Diagnosis not present

## 2019-11-27 DIAGNOSIS — M25612 Stiffness of left shoulder, not elsewhere classified: Secondary | ICD-10-CM

## 2019-11-27 DIAGNOSIS — M25611 Stiffness of right shoulder, not elsewhere classified: Secondary | ICD-10-CM
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
## 2019-11-30 ENCOUNTER — Ambulatory Visit: Payer: Medicaid Other | Admitting: Physical Therapy

## 2019-11-30 ENCOUNTER — Encounter: Payer: Self-pay | Admitting: Physical Therapy

## 2019-11-30 ENCOUNTER — Other Ambulatory Visit: Payer: Self-pay

## 2019-11-30 DIAGNOSIS — M25512 Pain in left shoulder: Secondary | ICD-10-CM

## 2019-11-30 DIAGNOSIS — M25612 Stiffness of left shoulder, not elsewhere classified: Secondary | ICD-10-CM

## 2019-11-30 DIAGNOSIS — I972 Postmastectomy lymphedema syndrome: Secondary | ICD-10-CM
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
## 2019-12-07 ENCOUNTER — Encounter: Payer: Self-pay | Admitting: Physical Therapy

## 2019-12-07 ENCOUNTER — Ambulatory Visit: Payer: Medicaid Other | Admitting: Physical Therapy

## 2019-12-07 ENCOUNTER — Other Ambulatory Visit: Payer: Self-pay

## 2019-12-07 DIAGNOSIS — M25512 Pain in left shoulder: Secondary | ICD-10-CM | POA: Diagnosis not present

## 2019-12-07 DIAGNOSIS — M25612 Stiffness of left shoulder, not elsewhere classified: Secondary | ICD-10-CM

## 2019-12-07 DIAGNOSIS — I972 Postmastectomy lymphedema syndrome: Secondary | ICD-10-CM
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
## 2019-12-11 ENCOUNTER — Ambulatory Visit: Payer: Medicaid Other | Attending: Pain Medicine | Admitting: Rehabilitation

## 2019-12-11 DIAGNOSIS — M25512 Pain in left shoulder: Secondary | ICD-10-CM | POA: Insufficient documentation

## 2019-12-11 DIAGNOSIS — M25612 Stiffness of left shoulder, not elsewhere classified: Secondary | ICD-10-CM | POA: Insufficient documentation

## 2019-12-11 DIAGNOSIS — I972 Postmastectomy lymphedema syndrome: Secondary | ICD-10-CM | POA: Insufficient documentation

## 2019-12-14 ENCOUNTER — Ambulatory Visit: Payer: Medicaid Other | Admitting: Physical Therapy

## 2019-12-14 ENCOUNTER — Other Ambulatory Visit: Payer: Self-pay

## 2019-12-14 DIAGNOSIS — I972 Postmastectomy lymphedema syndrome: Secondary | ICD-10-CM | POA: Diagnosis present

## 2019-12-14 DIAGNOSIS — M25512 Pain in left shoulder: Secondary | ICD-10-CM

## 2019-12-14 DIAGNOSIS — M25612 Stiffness of left shoulder, not elsewhere classified: Secondary | ICD-10-CM
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
## 2019-12-21 ENCOUNTER — Ambulatory Visit: Payer: Medicaid Other | Admitting: Physical Therapy

## 2019-12-21 ENCOUNTER — Other Ambulatory Visit: Payer: Self-pay

## 2019-12-21 ENCOUNTER — Encounter: Payer: Self-pay | Admitting: Physical Therapy

## 2019-12-21 DIAGNOSIS — M25512 Pain in left shoulder: Secondary | ICD-10-CM | POA: Diagnosis not present

## 2019-12-21 DIAGNOSIS — I972 Postmastectomy lymphedema syndrome: Secondary | ICD-10-CM

## 2019-12-21 DIAGNOSIS — M25612 Stiffness of left shoulder, not elsewhere classified: Secondary | ICD-10-CM
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
## 2019-12-28 ENCOUNTER — Ambulatory Visit: Payer: Medicaid Other | Admitting: Physical Therapy

## 2020-02-11 ENCOUNTER — Other Ambulatory Visit: Payer: Self-pay

## 2020-02-11 ENCOUNTER — Emergency Department: Payer: Medicaid Other

## 2020-02-11 ENCOUNTER — Emergency Department
Admission: EM | Admit: 2020-02-11 | Discharge: 2020-02-11 | Disposition: A | Discharge status: Home / Self Care | Payer: Medicaid Other | Attending: Emergency Medicine | Admitting: Emergency Medicine

## 2020-02-11 DIAGNOSIS — R1084 Generalized abdominal pain: Secondary | ICD-10-CM | POA: Diagnosis not present

## 2020-02-11 DIAGNOSIS — Z853 Personal history of malignant neoplasm of breast: Secondary | ICD-10-CM | POA: Diagnosis not present

## 2020-02-11 DIAGNOSIS — R519 Headache, unspecified: Secondary | ICD-10-CM | POA: Diagnosis not present

## 2020-02-11 DIAGNOSIS — M797 Fibromyalgia: Secondary | ICD-10-CM | POA: Diagnosis not present

## 2020-02-11 DIAGNOSIS — M79661 Pain in right lower leg: Secondary | ICD-10-CM | POA: Diagnosis not present

## 2020-02-11 DIAGNOSIS — R079 Chest pain, unspecified: Secondary | ICD-10-CM

## 2020-02-11 DIAGNOSIS — M79662 Pain in left lower leg: Secondary | ICD-10-CM | POA: Insufficient documentation

## 2020-02-11 DIAGNOSIS — Y92410 Unspecified street and highway as the place of occurrence of the external cause: Secondary | ICD-10-CM | POA: Diagnosis not present

## 2020-02-11 DIAGNOSIS — J45909 Unspecified asthma, uncomplicated: Secondary | ICD-10-CM | POA: Insufficient documentation

## 2020-02-11 DIAGNOSIS — M542 Cervicalgia: Secondary | ICD-10-CM | POA: Insufficient documentation

## 2020-02-11 LAB — COMPREHENSIVE METABOLIC PANEL
ALT: 11 U/L (ref 0–44)
AST: 15 U/L (ref 15–41)
Albumin: 3.7 g/dL (ref 3.5–5.0)
Alkaline Phosphatase: 67 U/L (ref 38–126)
Anion gap: 8 (ref 5–15)
BUN: 14 mg/dL (ref 6–20)
CO2: 29 mmol/L (ref 22–32)
Calcium: 9.4 mg/dL (ref 8.9–10.3)
Chloride: 104 mmol/L (ref 98–111)
Creatinine, Ser: 0.67 mg/dL (ref 0.44–1.00)
GFR, Estimated: >60 mL/min (ref >60)
Glucose, Bld: 96 mg/dL (ref 70–99)
Potassium: 3.7 mmol/L (ref 3.5–5.1)
Sodium: 141 mmol/L (ref 135–145)
Total Bilirubin: 0.5 mg/dL (ref 0.3–1.2)
Total Protein: 7.2 g/dL (ref 6.5–8.1)

## 2020-02-11 LAB — URINALYSIS, COMPLETE (UACMP) WITH MICROSCOPIC
Bilirubin Urine: NEGATIVE
Glucose, UA: NEGATIVE mg/dL
Ketones, ur: NEGATIVE mg/dL
Leukocytes,Ua: NEGATIVE
Nitrite: NEGATIVE
Protein, ur: NEGATIVE mg/dL
Specific Gravity, Urine: 1.02 (ref 1.005–1.030)
pH: 6.5 (ref 5.0–8.0)

## 2020-02-11 LAB — CBC WITH DIFFERENTIAL/PLATELET
Abs Immature Granulocytes: 0.04 10*3/uL (ref 0.00–0.07)
Basophils Absolute: 0 10*3/uL (ref 0.0–0.1)
Basophils Relative: 0 %
Eosinophils Absolute: 0 10*3/uL (ref 0.0–0.5)
Eosinophils Relative: 0 %
HCT: 40.2 % (ref 36.0–46.0)
Hemoglobin: 14.2 g/dL (ref 12.0–15.0)
Immature Granulocytes: 1 %
Lymphocytes Relative: 20 %
Lymphs Abs: 1.6 10*3/uL (ref 0.7–4.0)
MCH: 33.3 pg (ref 26.0–34.0)
MCHC: 35.3 g/dL (ref 30.0–36.0)
MCV: 94.1 fL (ref 80.0–100.0)
Monocytes Absolute: 0.6 10*3/uL (ref 0.1–1.0)
Monocytes Relative: 8 %
Neutro Abs: 5.7 10*3/uL (ref 1.7–7.7)
Neutrophils Relative %: 71 %
Platelets: 311 10*3/uL (ref 150–400)
RBC: 4.27 MIL/uL (ref 3.87–5.11)
RDW: 12.7 % (ref 11.5–15.5)
WBC: 8.1 10*3/uL (ref 4.0–10.5)
nRBC: 0 % (ref 0.0–0.2)

## 2020-02-11 LAB — TROPONIN I (HIGH SENSITIVITY): Troponin I (High Sensitivity): 3 ng/L (ref <18)

## 2020-02-11 MED ORDER — ACETAMINOPHEN 325 MG PO TABS
650.0000 mg | ORAL_TABLET | Freq: Once | ORAL | Status: AC
  Administered 2020-02-11: 650 mg via ORAL
  Filled 2020-02-11: qty 2

## 2020-02-11 MED ORDER — IOHEXOL 300 MG/ML  SOLN
100.0000 mL | Freq: Once | INTRAMUSCULAR | Status: AC | PRN
  Administered 2020-02-11: 100 mL via INTRAVENOUS
  Filled 2020-02-11: qty 100

## 2020-02-11 MED ORDER — KETOROLAC TROMETHAMINE 30 MG/ML IJ SOLN
30.0000 mg | Freq: Once | INTRAMUSCULAR | Status: AC
  Administered 2020-02-11: 30 mg via INTRAVENOUS
  Filled 2020-02-11: qty 1

## 2020-02-11 MED ORDER — METHOCARBAMOL 500 MG PO TABS
750.0000 mg | ORAL_TABLET | Freq: Once | ORAL | Status: AC
  Administered 2020-02-11: 750 mg via ORAL
  Filled 2020-02-11: qty 2
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
## 2020-02-11 NOTE — ED Notes (Signed)
Pt given sandwich tray 

## 2020-02-11 NOTE — ED Notes (Signed)
Pt moved from recliner to bed using 4 staff and a slide board

## 2020-02-11 NOTE — ED Notes (Signed)
Pt ambulated with this RN and Orthoptist. Pt with steady gait with 2 RN assist. Pt stating normally uses walker at home when pain is severe. PA made aware
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
## 2020-07-04 ENCOUNTER — Ambulatory Visit (HOSPITAL_BASED_OUTPATIENT_CLINIC_OR_DEPARTMENT_OTHER): Payer: Medicaid Other | Attending: Pain Medicine | Admitting: Physical Therapy

## 2020-07-04 DIAGNOSIS — M25552 Pain in left hip: Secondary | ICD-10-CM | POA: Insufficient documentation

## 2020-07-04 DIAGNOSIS — M25512 Pain in left shoulder: Secondary | ICD-10-CM | POA: Insufficient documentation

## 2020-07-04 DIAGNOSIS — M545 Low back pain, unspecified: Secondary | ICD-10-CM | POA: Insufficient documentation

## 2020-07-04 DIAGNOSIS — G8929 Other chronic pain: Secondary | ICD-10-CM | POA: Insufficient documentation

## 2020-07-04 DIAGNOSIS — M25511 Pain in right shoulder: Secondary | ICD-10-CM | POA: Insufficient documentation

## 2020-07-04 DIAGNOSIS — M25611 Stiffness of right shoulder, not elsewhere classified: Secondary | ICD-10-CM | POA: Insufficient documentation

## 2020-07-04 DIAGNOSIS — M25612 Stiffness of left shoulder, not elsewhere classified: Secondary | ICD-10-CM | POA: Insufficient documentation

## 2020-07-05 ENCOUNTER — Encounter (HOSPITAL_BASED_OUTPATIENT_CLINIC_OR_DEPARTMENT_OTHER): Payer: Self-pay | Admitting: Physical Therapy

## 2020-07-05 ENCOUNTER — Ambulatory Visit (HOSPITAL_BASED_OUTPATIENT_CLINIC_OR_DEPARTMENT_OTHER): Payer: Medicaid Other | Admitting: Physical Therapy

## 2020-07-05 ENCOUNTER — Other Ambulatory Visit: Payer: Self-pay

## 2020-07-05 DIAGNOSIS — M25511 Pain in right shoulder: Secondary | ICD-10-CM | POA: Diagnosis present

## 2020-07-05 DIAGNOSIS — M25612 Stiffness of left shoulder, not elsewhere classified: Secondary | ICD-10-CM

## 2020-07-05 DIAGNOSIS — G8929 Other chronic pain: Secondary | ICD-10-CM | POA: Diagnosis present

## 2020-07-05 DIAGNOSIS — M25512 Pain in left shoulder: Secondary | ICD-10-CM

## 2020-07-05 DIAGNOSIS — M545 Low back pain, unspecified: Secondary | ICD-10-CM | POA: Diagnosis present

## 2020-07-05 DIAGNOSIS — M25552 Pain in left hip: Secondary | ICD-10-CM | POA: Diagnosis present

## 2020-07-05 DIAGNOSIS — M25611 Stiffness of right shoulder, not elsewhere classified: Secondary | ICD-10-CM | POA: Diagnosis present

## 2020-07-06 ENCOUNTER — Encounter (HOSPITAL_BASED_OUTPATIENT_CLINIC_OR_DEPARTMENT_OTHER): Payer: Self-pay | Admitting: Physical Therapy
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
## 2020-07-07 ENCOUNTER — Encounter (HOSPITAL_BASED_OUTPATIENT_CLINIC_OR_DEPARTMENT_OTHER): Payer: Self-pay | Admitting: Physical Therapy

## 2020-07-07 ENCOUNTER — Ambulatory Visit (HOSPITAL_BASED_OUTPATIENT_CLINIC_OR_DEPARTMENT_OTHER): Payer: Medicaid Other | Admitting: Physical Therapy

## 2020-07-07 ENCOUNTER — Other Ambulatory Visit: Payer: Self-pay

## 2020-07-07 DIAGNOSIS — M25512 Pain in left shoulder: Secondary | ICD-10-CM

## 2020-07-07 DIAGNOSIS — M25511 Pain in right shoulder: Secondary | ICD-10-CM

## 2020-07-07 DIAGNOSIS — M25612 Stiffness of left shoulder, not elsewhere classified: Secondary | ICD-10-CM

## 2020-07-07 DIAGNOSIS — M25611 Stiffness of right shoulder, not elsewhere classified: Secondary | ICD-10-CM

## 2020-07-07 DIAGNOSIS — M545 Low back pain, unspecified: Secondary | ICD-10-CM
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
## 2020-07-12 ENCOUNTER — Encounter (HOSPITAL_BASED_OUTPATIENT_CLINIC_OR_DEPARTMENT_OTHER): Payer: Self-pay | Admitting: Physical Therapy

## 2020-07-12 ENCOUNTER — Ambulatory Visit (HOSPITAL_BASED_OUTPATIENT_CLINIC_OR_DEPARTMENT_OTHER): Payer: Medicaid Other | Attending: Pain Medicine | Admitting: Physical Therapy

## 2020-07-12 ENCOUNTER — Other Ambulatory Visit: Payer: Self-pay

## 2020-07-12 DIAGNOSIS — M25512 Pain in left shoulder: Secondary | ICD-10-CM | POA: Insufficient documentation

## 2020-07-12 DIAGNOSIS — I972 Postmastectomy lymphedema syndrome: Secondary | ICD-10-CM | POA: Diagnosis present

## 2020-07-12 DIAGNOSIS — M25511 Pain in right shoulder: Secondary | ICD-10-CM | POA: Diagnosis present

## 2020-07-12 DIAGNOSIS — G8929 Other chronic pain: Secondary | ICD-10-CM | POA: Diagnosis present

## 2020-07-12 DIAGNOSIS — M545 Low back pain, unspecified: Secondary | ICD-10-CM | POA: Diagnosis present

## 2020-07-12 DIAGNOSIS — M25612 Stiffness of left shoulder, not elsewhere classified: Secondary | ICD-10-CM | POA: Insufficient documentation

## 2020-07-12 DIAGNOSIS — M25552 Pain in left hip: Secondary | ICD-10-CM | POA: Insufficient documentation

## 2020-07-12 DIAGNOSIS — M25611 Stiffness of right shoulder, not elsewhere classified: Secondary | ICD-10-CM | POA: Diagnosis present
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
## 2020-07-14 ENCOUNTER — Ambulatory Visit (HOSPITAL_BASED_OUTPATIENT_CLINIC_OR_DEPARTMENT_OTHER): Payer: Medicaid Other | Admitting: Physical Therapy

## 2020-07-14 ENCOUNTER — Other Ambulatory Visit: Payer: Self-pay

## 2020-07-14 ENCOUNTER — Encounter (HOSPITAL_BASED_OUTPATIENT_CLINIC_OR_DEPARTMENT_OTHER): Payer: Self-pay | Admitting: Physical Therapy

## 2020-07-14 DIAGNOSIS — I972 Postmastectomy lymphedema syndrome: Secondary | ICD-10-CM

## 2020-07-14 DIAGNOSIS — M25512 Pain in left shoulder: Secondary | ICD-10-CM | POA: Diagnosis not present

## 2020-07-14 DIAGNOSIS — M25552 Pain in left hip: Secondary | ICD-10-CM

## 2020-07-14 DIAGNOSIS — G8929 Other chronic pain: Secondary | ICD-10-CM

## 2020-07-14 DIAGNOSIS — M25611 Stiffness of right shoulder, not elsewhere classified: Secondary | ICD-10-CM

## 2020-07-14 DIAGNOSIS — M25511 Pain in right shoulder: Secondary | ICD-10-CM

## 2020-07-14 DIAGNOSIS — M25612 Stiffness of left shoulder, not elsewhere classified: Secondary | ICD-10-CM
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
## 2020-07-19 ENCOUNTER — Encounter (HOSPITAL_BASED_OUTPATIENT_CLINIC_OR_DEPARTMENT_OTHER): Payer: Self-pay | Admitting: Physical Therapy

## 2020-07-19 ENCOUNTER — Other Ambulatory Visit: Payer: Self-pay

## 2020-07-19 ENCOUNTER — Ambulatory Visit (HOSPITAL_BASED_OUTPATIENT_CLINIC_OR_DEPARTMENT_OTHER): Payer: Medicaid Other | Admitting: Physical Therapy

## 2020-07-19 DIAGNOSIS — M25611 Stiffness of right shoulder, not elsewhere classified: Secondary | ICD-10-CM

## 2020-07-19 DIAGNOSIS — M25612 Stiffness of left shoulder, not elsewhere classified: Secondary | ICD-10-CM

## 2020-07-19 DIAGNOSIS — M545 Low back pain, unspecified: Secondary | ICD-10-CM

## 2020-07-19 DIAGNOSIS — M25512 Pain in left shoulder: Secondary | ICD-10-CM | POA: Diagnosis not present

## 2020-07-19 DIAGNOSIS — G8929 Other chronic pain: Secondary | ICD-10-CM
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
## 2020-07-21 ENCOUNTER — Other Ambulatory Visit: Payer: Self-pay

## 2020-07-21 ENCOUNTER — Ambulatory Visit (HOSPITAL_BASED_OUTPATIENT_CLINIC_OR_DEPARTMENT_OTHER): Payer: Medicaid Other | Admitting: Physical Therapy

## 2020-07-21 DIAGNOSIS — M25612 Stiffness of left shoulder, not elsewhere classified: Secondary | ICD-10-CM

## 2020-07-21 DIAGNOSIS — M545 Low back pain, unspecified: Secondary | ICD-10-CM

## 2020-07-21 DIAGNOSIS — M25512 Pain in left shoulder: Secondary | ICD-10-CM

## 2020-07-21 DIAGNOSIS — G8929 Other chronic pain: Secondary | ICD-10-CM

## 2020-07-22 ENCOUNTER — Encounter (HOSPITAL_BASED_OUTPATIENT_CLINIC_OR_DEPARTMENT_OTHER): Payer: Self-pay | Admitting: Physical Therapy
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
## 2020-07-26 ENCOUNTER — Encounter (HOSPITAL_BASED_OUTPATIENT_CLINIC_OR_DEPARTMENT_OTHER): Payer: Self-pay | Admitting: Physical Therapy

## 2020-07-26 ENCOUNTER — Other Ambulatory Visit: Payer: Self-pay

## 2020-07-26 ENCOUNTER — Ambulatory Visit (HOSPITAL_BASED_OUTPATIENT_CLINIC_OR_DEPARTMENT_OTHER): Payer: Medicaid Other | Admitting: Physical Therapy

## 2020-07-26 DIAGNOSIS — M25512 Pain in left shoulder: Secondary | ICD-10-CM

## 2020-07-26 DIAGNOSIS — M25612 Stiffness of left shoulder, not elsewhere classified: Secondary | ICD-10-CM

## 2020-07-26 DIAGNOSIS — M545 Low back pain, unspecified: Secondary | ICD-10-CM

## 2020-07-26 DIAGNOSIS — G8929 Other chronic pain: Secondary | ICD-10-CM
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
## 2020-07-29 ENCOUNTER — Ambulatory Visit (HOSPITAL_BASED_OUTPATIENT_CLINIC_OR_DEPARTMENT_OTHER): Payer: Medicaid Other | Admitting: Physical Therapy

## 2020-07-29 ENCOUNTER — Other Ambulatory Visit: Payer: Self-pay

## 2020-07-29 ENCOUNTER — Encounter (HOSPITAL_BASED_OUTPATIENT_CLINIC_OR_DEPARTMENT_OTHER): Payer: Self-pay | Admitting: Physical Therapy

## 2020-07-29 DIAGNOSIS — M25512 Pain in left shoulder: Secondary | ICD-10-CM | POA: Diagnosis not present

## 2020-07-29 DIAGNOSIS — M545 Low back pain, unspecified: Secondary | ICD-10-CM

## 2020-07-29 DIAGNOSIS — M25612 Stiffness of left shoulder, not elsewhere classified: Secondary | ICD-10-CM
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
## 2020-08-02 ENCOUNTER — Other Ambulatory Visit: Payer: Self-pay

## 2020-08-02 ENCOUNTER — Ambulatory Visit (HOSPITAL_BASED_OUTPATIENT_CLINIC_OR_DEPARTMENT_OTHER): Payer: Medicaid Other | Admitting: Physical Therapy

## 2020-08-02 ENCOUNTER — Encounter (HOSPITAL_BASED_OUTPATIENT_CLINIC_OR_DEPARTMENT_OTHER): Payer: Self-pay | Admitting: Physical Therapy

## 2020-08-02 DIAGNOSIS — M25552 Pain in left hip: Secondary | ICD-10-CM

## 2020-08-02 DIAGNOSIS — M25512 Pain in left shoulder: Secondary | ICD-10-CM

## 2020-08-02 DIAGNOSIS — M25612 Stiffness of left shoulder, not elsewhere classified: Secondary | ICD-10-CM

## 2020-08-02 DIAGNOSIS — M25611 Stiffness of right shoulder, not elsewhere classified: Secondary | ICD-10-CM

## 2020-08-02 DIAGNOSIS — G8929 Other chronic pain: Secondary | ICD-10-CM

## 2020-08-02 DIAGNOSIS — M25511 Pain in right shoulder: Secondary | ICD-10-CM
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
## 2020-08-04 ENCOUNTER — Ambulatory Visit (HOSPITAL_BASED_OUTPATIENT_CLINIC_OR_DEPARTMENT_OTHER): Payer: Medicaid Other | Admitting: Physical Therapy

## 2020-08-04 ENCOUNTER — Other Ambulatory Visit: Payer: Self-pay

## 2020-08-04 ENCOUNTER — Encounter (HOSPITAL_BASED_OUTPATIENT_CLINIC_OR_DEPARTMENT_OTHER): Payer: Self-pay | Admitting: Physical Therapy

## 2020-08-04 DIAGNOSIS — M25512 Pain in left shoulder: Secondary | ICD-10-CM

## 2020-08-04 DIAGNOSIS — M545 Low back pain, unspecified: Secondary | ICD-10-CM

## 2020-08-04 DIAGNOSIS — M25612 Stiffness of left shoulder, not elsewhere classified: Secondary | ICD-10-CM

## 2020-08-04 DIAGNOSIS — M25611 Stiffness of right shoulder, not elsewhere classified: Secondary | ICD-10-CM

## 2020-08-05 ENCOUNTER — Encounter (HOSPITAL_BASED_OUTPATIENT_CLINIC_OR_DEPARTMENT_OTHER): Payer: Self-pay | Admitting: Physical Therapy
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
## 2020-08-17 ENCOUNTER — Other Ambulatory Visit: Payer: Self-pay

## 2020-08-17 ENCOUNTER — Encounter (HOSPITAL_BASED_OUTPATIENT_CLINIC_OR_DEPARTMENT_OTHER): Payer: Self-pay | Admitting: Physical Therapy

## 2020-08-17 ENCOUNTER — Ambulatory Visit (HOSPITAL_BASED_OUTPATIENT_CLINIC_OR_DEPARTMENT_OTHER): Payer: Medicaid Other | Attending: Pain Medicine | Admitting: Physical Therapy

## 2020-08-17 DIAGNOSIS — G8929 Other chronic pain: Secondary | ICD-10-CM | POA: Insufficient documentation

## 2020-08-17 DIAGNOSIS — M545 Low back pain, unspecified: Secondary | ICD-10-CM | POA: Diagnosis present

## 2020-08-17 DIAGNOSIS — I972 Postmastectomy lymphedema syndrome: Secondary | ICD-10-CM | POA: Diagnosis present

## 2020-08-17 DIAGNOSIS — M25511 Pain in right shoulder: Secondary | ICD-10-CM | POA: Insufficient documentation

## 2020-08-17 DIAGNOSIS — M25512 Pain in left shoulder: Secondary | ICD-10-CM | POA: Insufficient documentation

## 2020-08-17 DIAGNOSIS — M25552 Pain in left hip: Secondary | ICD-10-CM | POA: Diagnosis present

## 2020-08-17 DIAGNOSIS — M25611 Stiffness of right shoulder, not elsewhere classified: Secondary | ICD-10-CM | POA: Diagnosis present

## 2020-08-17 DIAGNOSIS — M25612 Stiffness of left shoulder, not elsewhere classified: Secondary | ICD-10-CM | POA: Diagnosis present
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
## 2020-08-24 ENCOUNTER — Other Ambulatory Visit: Payer: Self-pay

## 2020-08-24 ENCOUNTER — Ambulatory Visit (HOSPITAL_BASED_OUTPATIENT_CLINIC_OR_DEPARTMENT_OTHER): Payer: Medicaid Other | Attending: Pain Medicine | Admitting: Physical Therapy

## 2020-08-24 ENCOUNTER — Encounter (HOSPITAL_BASED_OUTPATIENT_CLINIC_OR_DEPARTMENT_OTHER): Payer: Self-pay | Admitting: Physical Therapy

## 2020-08-24 DIAGNOSIS — M545 Low back pain, unspecified: Secondary | ICD-10-CM | POA: Insufficient documentation

## 2020-08-24 DIAGNOSIS — G8929 Other chronic pain: Secondary | ICD-10-CM | POA: Insufficient documentation

## 2020-08-24 DIAGNOSIS — I972 Postmastectomy lymphedema syndrome: Secondary | ICD-10-CM | POA: Insufficient documentation

## 2020-08-24 DIAGNOSIS — M25511 Pain in right shoulder: Secondary | ICD-10-CM | POA: Insufficient documentation

## 2020-08-24 DIAGNOSIS — M25512 Pain in left shoulder: Secondary | ICD-10-CM | POA: Insufficient documentation

## 2020-08-24 DIAGNOSIS — M25611 Stiffness of right shoulder, not elsewhere classified: Secondary | ICD-10-CM

## 2020-08-24 DIAGNOSIS — M25552 Pain in left hip: Secondary | ICD-10-CM | POA: Insufficient documentation

## 2020-08-24 DIAGNOSIS — M25612 Stiffness of left shoulder, not elsewhere classified: Secondary | ICD-10-CM
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
## 2020-08-30 ENCOUNTER — Ambulatory Visit (HOSPITAL_BASED_OUTPATIENT_CLINIC_OR_DEPARTMENT_OTHER): Payer: Medicaid Other | Admitting: Physical Therapy

## 2020-08-30 ENCOUNTER — Other Ambulatory Visit: Payer: Self-pay

## 2020-08-30 ENCOUNTER — Encounter (HOSPITAL_BASED_OUTPATIENT_CLINIC_OR_DEPARTMENT_OTHER): Payer: Self-pay | Admitting: Physical Therapy

## 2020-08-30 DIAGNOSIS — M25512 Pain in left shoulder: Secondary | ICD-10-CM

## 2020-08-30 DIAGNOSIS — M25552 Pain in left hip: Secondary | ICD-10-CM

## 2020-08-30 DIAGNOSIS — M25511 Pain in right shoulder: Secondary | ICD-10-CM
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
## 2020-09-06 ENCOUNTER — Ambulatory Visit (HOSPITAL_BASED_OUTPATIENT_CLINIC_OR_DEPARTMENT_OTHER): Payer: Medicaid Other | Admitting: Physical Therapy

## 2020-09-06 ENCOUNTER — Encounter (HOSPITAL_BASED_OUTPATIENT_CLINIC_OR_DEPARTMENT_OTHER): Payer: Self-pay | Admitting: Physical Therapy

## 2020-09-06 ENCOUNTER — Other Ambulatory Visit: Payer: Self-pay

## 2020-09-06 DIAGNOSIS — M25552 Pain in left hip: Secondary | ICD-10-CM

## 2020-09-06 DIAGNOSIS — M25511 Pain in right shoulder: Secondary | ICD-10-CM

## 2020-09-06 DIAGNOSIS — I972 Postmastectomy lymphedema syndrome: Secondary | ICD-10-CM

## 2020-09-06 DIAGNOSIS — M25611 Stiffness of right shoulder, not elsewhere classified: Secondary | ICD-10-CM

## 2020-09-06 DIAGNOSIS — G8929 Other chronic pain: Secondary | ICD-10-CM

## 2020-09-06 DIAGNOSIS — M25512 Pain in left shoulder: Secondary | ICD-10-CM

## 2020-09-06 DIAGNOSIS — M25612 Stiffness of left shoulder, not elsewhere classified: Secondary | ICD-10-CM
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
## 2020-09-23 ENCOUNTER — Other Ambulatory Visit: Payer: Self-pay

## 2020-09-23 ENCOUNTER — Encounter (HOSPITAL_BASED_OUTPATIENT_CLINIC_OR_DEPARTMENT_OTHER): Payer: Self-pay | Admitting: Physical Therapy

## 2020-09-23 ENCOUNTER — Ambulatory Visit (HOSPITAL_BASED_OUTPATIENT_CLINIC_OR_DEPARTMENT_OTHER): Payer: Medicaid Other | Attending: Pain Medicine | Admitting: Physical Therapy

## 2020-09-23 DIAGNOSIS — M25512 Pain in left shoulder: Secondary | ICD-10-CM | POA: Diagnosis not present

## 2020-09-23 DIAGNOSIS — I972 Postmastectomy lymphedema syndrome: Secondary | ICD-10-CM

## 2020-09-23 DIAGNOSIS — M25552 Pain in left hip: Secondary | ICD-10-CM | POA: Diagnosis present

## 2020-09-23 DIAGNOSIS — M25612 Stiffness of left shoulder, not elsewhere classified: Secondary | ICD-10-CM

## 2020-09-23 DIAGNOSIS — M25611 Stiffness of right shoulder, not elsewhere classified: Secondary | ICD-10-CM

## 2020-09-23 DIAGNOSIS — M25511 Pain in right shoulder: Secondary | ICD-10-CM | POA: Diagnosis present

## 2020-09-23 DIAGNOSIS — M545 Low back pain, unspecified: Secondary | ICD-10-CM | POA: Diagnosis present

## 2020-09-23 DIAGNOSIS — G8929 Other chronic pain: Secondary | ICD-10-CM | POA: Diagnosis present
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
## 2020-09-30 ENCOUNTER — Ambulatory Visit (HOSPITAL_BASED_OUTPATIENT_CLINIC_OR_DEPARTMENT_OTHER): Payer: Medicaid Other | Admitting: Physical Therapy

## 2020-10-06 ENCOUNTER — Ambulatory Visit (HOSPITAL_BASED_OUTPATIENT_CLINIC_OR_DEPARTMENT_OTHER): Payer: Medicaid Other | Admitting: Physical Therapy

## 2020-10-06 ENCOUNTER — Other Ambulatory Visit: Payer: Self-pay

## 2020-10-06 ENCOUNTER — Encounter (HOSPITAL_BASED_OUTPATIENT_CLINIC_OR_DEPARTMENT_OTHER): Payer: Self-pay | Admitting: Physical Therapy

## 2020-10-06 DIAGNOSIS — I972 Postmastectomy lymphedema syndrome: Secondary | ICD-10-CM

## 2020-10-06 DIAGNOSIS — M25512 Pain in left shoulder: Secondary | ICD-10-CM | POA: Diagnosis not present

## 2020-10-06 DIAGNOSIS — M25511 Pain in right shoulder: Secondary | ICD-10-CM

## 2020-10-06 DIAGNOSIS — M25611 Stiffness of right shoulder, not elsewhere classified: Secondary | ICD-10-CM

## 2020-10-06 DIAGNOSIS — M545 Low back pain, unspecified: Secondary | ICD-10-CM

## 2020-10-06 DIAGNOSIS — M25552 Pain in left hip: Secondary | ICD-10-CM

## 2020-10-06 DIAGNOSIS — G8929 Other chronic pain: Secondary | ICD-10-CM

## 2020-10-06 DIAGNOSIS — M25612 Stiffness of left shoulder, not elsewhere classified: Secondary | ICD-10-CM
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
## 2020-10-14 ENCOUNTER — Other Ambulatory Visit: Payer: Self-pay

## 2020-10-14 ENCOUNTER — Ambulatory Visit (HOSPITAL_BASED_OUTPATIENT_CLINIC_OR_DEPARTMENT_OTHER): Payer: Medicaid Other | Attending: Pain Medicine | Admitting: Physical Therapy

## 2020-10-14 DIAGNOSIS — M25511 Pain in right shoulder: Secondary | ICD-10-CM | POA: Diagnosis present

## 2020-10-14 DIAGNOSIS — M25512 Pain in left shoulder: Secondary | ICD-10-CM | POA: Diagnosis not present

## 2020-10-14 DIAGNOSIS — M25611 Stiffness of right shoulder, not elsewhere classified: Secondary | ICD-10-CM | POA: Diagnosis present

## 2020-10-14 DIAGNOSIS — M25612 Stiffness of left shoulder, not elsewhere classified: Secondary | ICD-10-CM | POA: Diagnosis present

## 2020-10-14 DIAGNOSIS — M25552 Pain in left hip: Secondary | ICD-10-CM | POA: Insufficient documentation

## 2020-10-14 DIAGNOSIS — G8929 Other chronic pain: Secondary | ICD-10-CM | POA: Insufficient documentation

## 2020-10-14 DIAGNOSIS — M545 Low back pain, unspecified: Secondary | ICD-10-CM | POA: Insufficient documentation
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
## 2020-10-20 ENCOUNTER — Ambulatory Visit (HOSPITAL_BASED_OUTPATIENT_CLINIC_OR_DEPARTMENT_OTHER): Payer: Medicaid Other | Admitting: Physical Therapy

## 2020-10-20 ENCOUNTER — Other Ambulatory Visit: Payer: Self-pay

## 2020-10-20 ENCOUNTER — Encounter (HOSPITAL_BASED_OUTPATIENT_CLINIC_OR_DEPARTMENT_OTHER): Payer: Self-pay | Admitting: Physical Therapy

## 2020-10-20 DIAGNOSIS — M25611 Stiffness of right shoulder, not elsewhere classified: Secondary | ICD-10-CM

## 2020-10-20 DIAGNOSIS — M25612 Stiffness of left shoulder, not elsewhere classified: Secondary | ICD-10-CM

## 2020-10-20 DIAGNOSIS — G8929 Other chronic pain: Secondary | ICD-10-CM

## 2020-10-20 DIAGNOSIS — M25511 Pain in right shoulder: Secondary | ICD-10-CM

## 2020-10-20 DIAGNOSIS — M25512 Pain in left shoulder: Secondary | ICD-10-CM | POA: Diagnosis not present
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
## 2020-12-15 ENCOUNTER — Other Ambulatory Visit: Payer: Self-pay

## 2020-12-15 ENCOUNTER — Ambulatory Visit: Payer: Medicaid Other | Attending: Family Medicine

## 2020-12-15 DIAGNOSIS — G8929 Other chronic pain: Secondary | ICD-10-CM | POA: Diagnosis present

## 2020-12-15 DIAGNOSIS — M545 Low back pain, unspecified: Secondary | ICD-10-CM | POA: Diagnosis present

## 2020-12-15 DIAGNOSIS — M25512 Pain in left shoulder: Secondary | ICD-10-CM | POA: Diagnosis present

## 2020-12-15 DIAGNOSIS — M25511 Pain in right shoulder: Secondary | ICD-10-CM | POA: Diagnosis present

## 2020-12-15 DIAGNOSIS — Z170 Estrogen receptor positive status [ER+]: Secondary | ICD-10-CM | POA: Diagnosis present

## 2020-12-15 DIAGNOSIS — M25611 Stiffness of right shoulder, not elsewhere classified: Secondary | ICD-10-CM | POA: Insufficient documentation

## 2020-12-15 DIAGNOSIS — M25612 Stiffness of left shoulder, not elsewhere classified: Secondary | ICD-10-CM | POA: Insufficient documentation

## 2020-12-15 DIAGNOSIS — I972 Postmastectomy lymphedema syndrome: Secondary | ICD-10-CM | POA: Insufficient documentation

## 2020-12-15 DIAGNOSIS — C50812 Malignant neoplasm of overlapping sites of left female breast: Secondary | ICD-10-CM | POA: Diagnosis present
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
## 2020-12-22 ENCOUNTER — Encounter: Payer: Self-pay | Admitting: Physical Therapy

## 2020-12-22 ENCOUNTER — Other Ambulatory Visit: Payer: Self-pay

## 2020-12-22 ENCOUNTER — Ambulatory Visit: Payer: Medicaid Other | Admitting: Physical Therapy

## 2020-12-22 DIAGNOSIS — M25511 Pain in right shoulder: Secondary | ICD-10-CM

## 2020-12-22 DIAGNOSIS — M25611 Stiffness of right shoulder, not elsewhere classified: Secondary | ICD-10-CM

## 2020-12-22 DIAGNOSIS — M25512 Pain in left shoulder: Secondary | ICD-10-CM

## 2020-12-22 DIAGNOSIS — C50812 Malignant neoplasm of overlapping sites of left female breast: Secondary | ICD-10-CM | POA: Diagnosis not present

## 2020-12-22 DIAGNOSIS — I972 Postmastectomy lymphedema syndrome: Secondary | ICD-10-CM
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
## 2020-12-27 ENCOUNTER — Other Ambulatory Visit: Payer: Self-pay

## 2020-12-27 ENCOUNTER — Ambulatory Visit: Payer: Medicaid Other

## 2020-12-27 DIAGNOSIS — M25512 Pain in left shoulder: Secondary | ICD-10-CM

## 2020-12-27 DIAGNOSIS — Z170 Estrogen receptor positive status [ER+]: Secondary | ICD-10-CM

## 2020-12-27 DIAGNOSIS — C50812 Malignant neoplasm of overlapping sites of left female breast: Secondary | ICD-10-CM | POA: Diagnosis not present

## 2020-12-27 DIAGNOSIS — I972 Postmastectomy lymphedema syndrome: Secondary | ICD-10-CM

## 2020-12-27 DIAGNOSIS — M25511 Pain in right shoulder: Secondary | ICD-10-CM
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
## 2020-12-28 ENCOUNTER — Ambulatory Visit: Payer: Medicaid Other

## 2020-12-28 DIAGNOSIS — I972 Postmastectomy lymphedema syndrome: Secondary | ICD-10-CM

## 2020-12-28 DIAGNOSIS — C50812 Malignant neoplasm of overlapping sites of left female breast: Secondary | ICD-10-CM | POA: Diagnosis not present

## 2020-12-28 DIAGNOSIS — M25511 Pain in right shoulder: Secondary | ICD-10-CM

## 2020-12-28 DIAGNOSIS — M25512 Pain in left shoulder: Secondary | ICD-10-CM
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
## 2021-01-04 ENCOUNTER — Ambulatory Visit: Payer: Medicaid Other | Admitting: Rehabilitation

## 2021-01-04 ENCOUNTER — Other Ambulatory Visit: Payer: Self-pay

## 2021-01-04 ENCOUNTER — Encounter: Payer: Self-pay | Admitting: Rehabilitation

## 2021-01-04 DIAGNOSIS — Z170 Estrogen receptor positive status [ER+]: Secondary | ICD-10-CM

## 2021-01-04 DIAGNOSIS — M545 Low back pain, unspecified: Secondary | ICD-10-CM

## 2021-01-04 DIAGNOSIS — M25512 Pain in left shoulder: Secondary | ICD-10-CM

## 2021-01-04 DIAGNOSIS — C50812 Malignant neoplasm of overlapping sites of left female breast: Secondary | ICD-10-CM

## 2021-01-04 DIAGNOSIS — G8929 Other chronic pain: Secondary | ICD-10-CM

## 2021-01-04 DIAGNOSIS — M25611 Stiffness of right shoulder, not elsewhere classified: Secondary | ICD-10-CM

## 2021-01-04 DIAGNOSIS — M25612 Stiffness of left shoulder, not elsewhere classified: Secondary | ICD-10-CM

## 2021-01-04 DIAGNOSIS — I972 Postmastectomy lymphedema syndrome: Secondary | ICD-10-CM

## 2021-01-04 DIAGNOSIS — M25511 Pain in right shoulder: Secondary | ICD-10-CM
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
## 2021-01-06 ENCOUNTER — Other Ambulatory Visit: Payer: Self-pay

## 2021-01-06 ENCOUNTER — Encounter: Payer: Self-pay | Admitting: Rehabilitation

## 2021-01-06 ENCOUNTER — Ambulatory Visit: Payer: Medicaid Other | Admitting: Rehabilitation

## 2021-01-06 DIAGNOSIS — C50812 Malignant neoplasm of overlapping sites of left female breast: Secondary | ICD-10-CM

## 2021-01-06 DIAGNOSIS — M25611 Stiffness of right shoulder, not elsewhere classified: Secondary | ICD-10-CM

## 2021-01-06 DIAGNOSIS — M25612 Stiffness of left shoulder, not elsewhere classified: Secondary | ICD-10-CM

## 2021-01-06 DIAGNOSIS — M25512 Pain in left shoulder: Secondary | ICD-10-CM

## 2021-01-06 DIAGNOSIS — M25511 Pain in right shoulder: Secondary | ICD-10-CM

## 2021-01-06 DIAGNOSIS — I972 Postmastectomy lymphedema syndrome: Secondary | ICD-10-CM
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
